# Patient Record
Sex: Female | Born: 1950
Health system: Southern US, Community
[De-identification: ages and names within clinical notes are randomized; demographics above are authoritative.]

## PROBLEM LIST (undated history)

## (undated) DIAGNOSIS — F329 Major depressive disorder, single episode, unspecified: Secondary | ICD-10-CM

## (undated) DIAGNOSIS — I219 Acute myocardial infarction, unspecified: Secondary | ICD-10-CM

## (undated) DIAGNOSIS — I10 Essential (primary) hypertension: Secondary | ICD-10-CM

## (undated) DIAGNOSIS — F32A Depression, unspecified: Secondary | ICD-10-CM

## (undated) DIAGNOSIS — E119 Type 2 diabetes mellitus without complications: Secondary | ICD-10-CM

## (undated) DIAGNOSIS — E785 Hyperlipidemia, unspecified: Secondary | ICD-10-CM

## (undated) DIAGNOSIS — I2510 Atherosclerotic heart disease of native coronary artery without angina pectoris: Secondary | ICD-10-CM

## (undated) DIAGNOSIS — I739 Peripheral vascular disease, unspecified: Secondary | ICD-10-CM

## (undated) DIAGNOSIS — K529 Noninfective gastroenteritis and colitis, unspecified: Secondary | ICD-10-CM

## (undated) DIAGNOSIS — I679 Cerebrovascular disease, unspecified: Secondary | ICD-10-CM

## (undated) DIAGNOSIS — F419 Anxiety disorder, unspecified: Secondary | ICD-10-CM

## (undated) HISTORY — DX: Noninfective gastroenteritis and colitis, unspecified: K52.9

## (undated) HISTORY — DX: Anxiety disorder, unspecified: F41.9

## (undated) HISTORY — DX: Depression, unspecified: F32.A

## (undated) HISTORY — DX: Major depressive disorder, single episode, unspecified: F32.9

## (undated) HISTORY — DX: Hyperlipidemia, unspecified: E78.5

## (undated) HISTORY — DX: Peripheral vascular disease, unspecified: I73.9

## (undated) HISTORY — PX: BREAST BIOPSY: SHX20

## (undated) HISTORY — PX: ABDOMINAL HYSTERECTOMY: SHX81

## (undated) HISTORY — DX: Cerebrovascular disease, unspecified: I67.9

## (undated) HISTORY — DX: Atherosclerotic heart disease of native coronary artery without angina pectoris: I25.10

## (undated) HISTORY — DX: Essential (primary) hypertension: I10

---

## 2006-11-13 HISTORY — PX: CORONARY ANGIOPLASTY WITH STENT PLACEMENT: SHX49

## 2006-11-23 ENCOUNTER — Inpatient Hospital Stay (HOSPITAL_COMMUNITY): Admission: EM | Admit: 2006-11-23 | Discharge: 2006-11-25 | Payer: Self-pay | Admitting: Emergency Medicine

## 2006-11-23 ENCOUNTER — Ambulatory Visit: Payer: Self-pay | Admitting: Cardiology

## 2006-12-07 ENCOUNTER — Ambulatory Visit: Payer: Self-pay | Admitting: Cardiology

## 2006-12-13 ENCOUNTER — Ambulatory Visit: Payer: Self-pay | Admitting: Cardiology

## 2006-12-16 ENCOUNTER — Observation Stay (HOSPITAL_COMMUNITY): Admission: AD | Admit: 2006-12-16 | Discharge: 2006-12-17 | Payer: Self-pay | Admitting: Cardiovascular Disease

## 2006-12-16 ENCOUNTER — Ambulatory Visit: Payer: Self-pay | Admitting: Cardiovascular Disease

## 2006-12-17 ENCOUNTER — Encounter: Payer: Self-pay | Admitting: Cardiology

## 2006-12-25 ENCOUNTER — Ambulatory Visit: Payer: Self-pay | Admitting: Cardiology

## 2006-12-25 ENCOUNTER — Encounter: Payer: Self-pay | Admitting: Cardiology

## 2006-12-31 ENCOUNTER — Ambulatory Visit: Payer: Self-pay | Admitting: Cardiology

## 2007-05-28 ENCOUNTER — Ambulatory Visit: Payer: Self-pay | Admitting: Cardiology

## 2007-06-11 ENCOUNTER — Ambulatory Visit: Payer: Self-pay | Admitting: Cardiology

## 2007-06-28 ENCOUNTER — Ambulatory Visit: Payer: Self-pay

## 2007-07-18 ENCOUNTER — Ambulatory Visit: Payer: Self-pay | Admitting: Cardiology

## 2007-10-17 ENCOUNTER — Ambulatory Visit: Payer: Self-pay | Admitting: Cardiology

## 2007-11-26 ENCOUNTER — Encounter: Payer: Self-pay | Admitting: Physician Assistant

## 2008-08-31 ENCOUNTER — Encounter: Payer: Self-pay | Admitting: Cardiology

## 2008-09-22 ENCOUNTER — Ambulatory Visit: Payer: Self-pay | Admitting: Physician Assistant

## 2008-09-22 ENCOUNTER — Encounter: Payer: Self-pay | Admitting: Cardiology

## 2008-10-01 ENCOUNTER — Ambulatory Visit: Payer: Self-pay | Admitting: Cardiology

## 2008-10-01 ENCOUNTER — Encounter: Payer: Self-pay | Admitting: Cardiology

## 2008-12-23 ENCOUNTER — Ambulatory Visit: Payer: Self-pay | Admitting: Cardiology

## 2009-01-06 ENCOUNTER — Encounter: Payer: Self-pay | Admitting: Cardiology

## 2009-01-28 ENCOUNTER — Ambulatory Visit: Payer: Self-pay | Admitting: Cardiology

## 2009-01-29 ENCOUNTER — Observation Stay (HOSPITAL_COMMUNITY): Admission: AD | Admit: 2009-01-29 | Discharge: 2009-01-29 | Payer: Self-pay | Admitting: Cardiology

## 2009-01-29 ENCOUNTER — Encounter: Payer: Self-pay | Admitting: Cardiology

## 2009-01-29 ENCOUNTER — Ambulatory Visit: Payer: Self-pay | Admitting: Internal Medicine

## 2009-02-10 ENCOUNTER — Ambulatory Visit: Payer: Self-pay | Admitting: Physician Assistant

## 2009-09-21 ENCOUNTER — Ambulatory Visit: Payer: Self-pay | Admitting: Cardiology

## 2009-09-21 ENCOUNTER — Encounter (INDEPENDENT_AMBULATORY_CARE_PROVIDER_SITE_OTHER): Payer: Self-pay | Admitting: *Deleted

## 2009-09-21 DIAGNOSIS — F341 Dysthymic disorder: Secondary | ICD-10-CM

## 2009-09-21 DIAGNOSIS — K551 Chronic vascular disorders of intestine: Secondary | ICD-10-CM

## 2009-09-21 DIAGNOSIS — I10 Essential (primary) hypertension: Secondary | ICD-10-CM

## 2009-09-21 DIAGNOSIS — F1020 Alcohol dependence, uncomplicated: Secondary | ICD-10-CM

## 2009-09-21 DIAGNOSIS — F411 Generalized anxiety disorder: Secondary | ICD-10-CM | POA: Insufficient documentation

## 2009-09-21 DIAGNOSIS — I701 Atherosclerosis of renal artery: Secondary | ICD-10-CM | POA: Insufficient documentation

## 2009-09-21 DIAGNOSIS — E785 Hyperlipidemia, unspecified: Secondary | ICD-10-CM

## 2009-09-21 DIAGNOSIS — R609 Edema, unspecified: Secondary | ICD-10-CM

## 2009-09-27 ENCOUNTER — Ambulatory Visit: Payer: Self-pay | Admitting: Cardiology

## 2009-10-22 ENCOUNTER — Encounter (INDEPENDENT_AMBULATORY_CARE_PROVIDER_SITE_OTHER): Payer: Self-pay | Admitting: *Deleted

## 2009-10-22 ENCOUNTER — Encounter: Payer: Self-pay | Admitting: Cardiology

## 2009-11-01 ENCOUNTER — Encounter: Payer: Self-pay | Admitting: Cardiology

## 2009-11-04 ENCOUNTER — Encounter: Payer: Self-pay | Admitting: Cardiology

## 2009-11-08 ENCOUNTER — Ambulatory Visit: Payer: Self-pay | Admitting: Cardiovascular Disease

## 2009-11-08 ENCOUNTER — Inpatient Hospital Stay (HOSPITAL_BASED_OUTPATIENT_CLINIC_OR_DEPARTMENT_OTHER): Admission: RE | Admit: 2009-11-08 | Discharge: 2009-11-08 | Payer: Self-pay | Admitting: Cardiovascular Disease

## 2009-12-01 ENCOUNTER — Ambulatory Visit: Payer: Self-pay | Admitting: Cardiology

## 2010-07-08 ENCOUNTER — Ambulatory Visit: Payer: Self-pay | Admitting: Cardiology

## 2010-07-08 DIAGNOSIS — G4700 Insomnia, unspecified: Secondary | ICD-10-CM

## 2010-12-13 NOTE — Cardiovascular Report (Signed)
Summary: Cardiac Catheterization  Cardiac Catheterization   Imported By: Dorise Hiss 07/07/2010 13:33:34  _____________________________________________________________________  External Attachment:    Type:   Image     Comment:   External Document

## 2010-12-13 NOTE — Assessment & Plan Note (Signed)
Summary: 6 mo fu per july reminder   Visit Type:  Follow-up Primary Provider:  Dr. Lia Hopping   History of Present Illness: the patient is an 60 year old female with a history of non-ST elevation myocardial infarction status post bare-metal stenting of the proximal circumflex coronary artery she has normal LV function.  Chest peripheral arterial disease with mild left renal artery stenosis and severe ostial SMA stenosis.  She has nonobstructive cerebrovascular disease.  From a cardiac standpoint she is stable.  She denies any chest pain or shortness of breath.  Lipid panels followed by Dr. Olena Leatherwood.  Her only complaint today in the office is significant insomnia.  The patient has history of anxiety and depression.  She denies any palpitations or syncope orthopnea or PND.  Preventive Screening-Counseling & Management  Alcohol-Tobacco     Smoking Status: never  Current Medications (verified): 1)  Carvedilol 12.5 Mg Tabs (Carvedilol) .... Take One Tablet By Mouth Twice A Day 2)  Allegra 180 Mg Tabs (Fexofenadine Hcl) .... Take 1 Tablet By Mouth As Directed As Needed 3)  Amlodipine Besylate 10 Mg Tabs (Amlodipine Besylate) .... Take 1 Tablet By Mouth Once A Day 4)  Aspirin 81 Mg Tbec (Aspirin) .... Take One Tablet By Mouth Daily 5)  Plavix 75 Mg Tabs (Clopidogrel Bisulfate) .... Take 1 Tablet By Mouth Once A Day 6)  Vytorin 10-20 Mg Tabs (Ezetimibe-Simvastatin) .... Take 1 Tablet By Mouth Once A Day 7)  Nitrostat 0.4 Mg Subl (Nitroglycerin) .... Use As Directed 8)  Alprazolam 0.25 Mg Tabs (Alprazolam) .... Take 1 Tablet By Mouth Three Times A Day As Needed 9)  Effexor Xr 75 Mg Xr24h-Cap (Venlafaxine Hcl) .... Take 3 Tablet By Mouth Once A Day 10)  Preservision/lutein  Caps (Multiple Vitamins-Minerals) .... Take 1 Tablet By Mouth Two Times A Day 11)  Hydrochlorothiazide 25 Mg Tabs (Hydrochlorothiazide) .... Take One Tablet By Mouth Daily. 12)  Isosorbide Mononitrate Cr 30 Mg Xr24h-Tab  (Isosorbide Mononitrate) .... Take 1 Tablet By Mouth Once A Day 13)  Potassium Chloride Crys Cr 20 Meq Cr-Tabs (Potassium Chloride Crys Cr) .... Take One Tablet By Mouth Daily 14)  Fish Oil 1000 Mg Caps (Omega-3 Fatty Acids) .... Take 1 Tablet By Mouth Two Times A Day 15)  One-A-Day Extras Antioxidant  Caps (Multiple Vitamins-Minerals) .... Take 1 Tablet By Mouth Once A Day 16)  Trazodone Hcl 50 Mg Tabs (Trazodone Hcl) .... Take 1 Tab By Mouth At Bedtime  Allergies: 1)  ! Pcn  Comments:  Nurse/Medical Assistant: The patient's medication list and allergies were reviewed with the patient and were updated in the Medication and Allergy Lists.  Past History:  Past Medical History: Last updated: 09/21/2009 CAD A.  status post NST EMI/bare-metal stenting of proximal CFX, January 2008: widely patent stent site by a relook cardiac catheterization, February 2008. B.  Hyperdynamic left ventricular function peripheral arterial disease A.  mild left renal artery stenosis B.  severe, ostial SMA stenosis nonobstructive cerebrovascular disease dyslipidemia hypertension anxiety/depression colitis  1. Coronary artery disease with jailed obtuse marginal branch     approximately 80% stenosis. 2. Hypertension controlled. 3. Peripheral arterial disease. 4. Nonobstructive cardiovascular disease. 5. Dyslipidemia.   Past Surgical History: Last updated: 12/23/2008 status post hysterectomy and  Family History: Last updated: 12/23/2008 Negative FH of Diabetes, Hypertension, or Coronary Artery Disease  Social History: Last updated: 12/23/2008 Tobacco Use - No.   Risk Factors: Smoking Status: never (07/08/2010)  Review of Systems  The patient denies fatigue,  malaise, fever, weight gain/loss, vision loss, decreased hearing, hoarseness, chest pain, palpitations, shortness of breath, prolonged cough, wheezing, sleep apnea, coughing up blood, abdominal pain, blood in stool, nausea, vomiting,  diarrhea, heartburn, incontinence, blood in urine, muscle weakness, joint pain, leg swelling, rash, skin lesions, headache, fainting, dizziness, depression, anxiety, enlarged lymph nodes, easy bruising or bleeding, and environmental allergies.    Vital Signs:  Patient profile:   60 year old female Height:      59 inches Weight:      150 pounds Pulse rate:   69 / minute BP sitting:   118 / 67  (left arm) Cuff size:   regular  Vitals Entered By: Carlye Grippe (July 08, 2010 10:54 AM)  Physical Exam  Additional Exam:  General: Well-developed, well-nourished in no distress head: Normocephalic and atraumatic eyes PERRLA/EOMI intact, conjunctiva and lids normal nose: No deformity or lesions mouth normal dentition, normal posterior pharynx neck: Supple, no JVD.  No masses, thyromegaly or abnormal cervical nodes lungs: Normal breath sounds bilaterally without wheezing.  Normal percussion heart: regular rate and rhythm with normal S1 and S2, no S3 or S4.  PMI is normal.  No pathological murmurs abdomen: Normal bowel sounds, abdomen is soft and nontender without masses, organomegaly or hernias noted.  No hepatosplenomegaly musculoskeletal: Back normal, normal gait muscle strength and tone normal pulsus: Pulse is normal in all 4 extremities Extremities: No peripheral pitting edema neurologic: Alert and oriented x 3 skin: Intact without lesions or rashes cervical nodes: No significant adenopathy psychologic: Normal affect    Impression & Recommendations:  Problem # 1:  ESSENTIAL HYPERTENSION, BENIGN (ICD-401.1) controlled on current medical regimen. Her updated medication list for this problem includes:    Carvedilol 12.5 Mg Tabs (Carvedilol) .Marland Kitchen... Take one tablet by mouth twice a day    Amlodipine Besylate 10 Mg Tabs (Amlodipine besylate) .Marland Kitchen... Take 1 tablet by mouth once a day    Aspirin 81 Mg Tbec (Aspirin) .Marland Kitchen... Take one tablet by mouth daily    Hydrochlorothiazide 25 Mg Tabs  (Hydrochlorothiazide) .Marland Kitchen... Take one tablet by mouth daily.  Problem # 2:  CORONARY ARTERY DISEASE, S/P PTCA (ICD-414.9) no recurrent chest pain.  No ischemia workup required. Her updated medication list for this problem includes:    Carvedilol 12.5 Mg Tabs (Carvedilol) .Marland Kitchen... Take one tablet by mouth twice a day    Amlodipine Besylate 10 Mg Tabs (Amlodipine besylate) .Marland Kitchen... Take 1 tablet by mouth once a day    Aspirin 81 Mg Tbec (Aspirin) .Marland Kitchen... Take one tablet by mouth daily    Plavix 75 Mg Tabs (Clopidogrel bisulfate) .Marland Kitchen... Take 1 tablet by mouth once a day    Nitrostat 0.4 Mg Subl (Nitroglycerin) ..... Use as directed    Isosorbide Mononitrate Cr 30 Mg Xr24h-tab (Isosorbide mononitrate) .Marland Kitchen... Take 1 tablet by mouth once a day  Problem # 3:  INSOMNIA (ICD-780.52) have given the patient prescription of trazodone 50 mg p.o. nightly particularly given her positive anxiety and depression  Patient Instructions: 1)  Trazodone 50 mg daily 2)  Follow up in  6 months Prescriptions: TRAZODONE HCL 50 MG TABS (TRAZODONE HCL) Take 1 tab by mouth at bedtime  #30 x 3   Entered by:   Hoover Brunette, LPN   Authorized by:   Lewayne Bunting, MD, [facility]   Signed by:   Hoover Brunette, LPN on 16/08/9603   Method used:   Electronically to        [facility] [address] (  retail)       [address]       [place], [place]  [address]       Ph: [phone] or [phone]       Fax: [phone]   RxID:   [id number]

## 2010-12-13 NOTE — Cardiovascular Report (Signed)
Summary: Cardiac Catheterization  Cardiac Catheterization   Imported By: Dorise Hiss 07/07/2010 13:35:00  _____________________________________________________________________  External Attachment:    Type:   Image     Comment:   External Document

## 2010-12-13 NOTE — Letter (Signed)
Summary: External Correspondence/ FAXED PRE-CATH ORDER  External Correspondence/ FAXED PRE-CATH ORDER   Imported By: Dorise Hiss 11/18/2009 11:58:21  _____________________________________________________________________  External Attachment:    Type:   Image     Comment:   External Document

## 2010-12-13 NOTE — Assessment & Plan Note (Signed)
Summary: eph-post cath 2 wk fu -srs   Visit Type:  Follow-up Primary Provider:  Dr. Lia Hopping  CC:  follow-up visit.  History of Present Illness: the patient is a 60 year old female with a history of coronary artery disease. Status post stent placement to the circumflex coronary artery. She also has a large second obtuse marginal branch with an 80% ostial lesion which is jailed. Chest normal LV function. In December she developed chest pain and had a Cardiolite study done which was abnormal. The patient was referred for cardiac catheterization, but no change in her anatomy was found. Medical treatment was recommended. The patient states that she can quite well. Has no chest pain orthopnea PND palpitations or syncope. She is compliant with her medical regimen. She remains on aspirin and Plavix. She reports no complication related to her recent cardiac catheterization.  Preventive Screening-Counseling & Management  Alcohol-Tobacco     Smoking Status: never  Current Problems (verified): 1)  Dysthymic Disorder  (ICD-300.4) 2)  Alcoholism  (ICD-303.90) 3)  Leg Edema  (ICD-782.3) 4)  Anxiety  (ICD-300.00) 5)  Dyslipidemia  (ICD-272.4) 6)  Essential Hypertension, Benign  (ICD-401.1) 7)  Chronic Vascular Insufficiency of Intestine  (ICD-557.1) 8)  Renal Artery Stenosis  (ICD-440.1) 9)  Coronary Artery Disease, S/p Ptca  (ICD-414.9)  Current Medications (verified): 1)  Carvedilol 12.5 Mg Tabs (Carvedilol) .... Take One Tablet By Mouth Twice A Day 2)  Allegra 180 Mg Tabs (Fexofenadine Hcl) .... Take 1 Tablet By Mouth As Directed 3)  Amlodipine Besylate 10 Mg Tabs (Amlodipine Besylate) .... Take 1 Tablet By Mouth Once A Day 4)  Aspirin 81 Mg Tbec (Aspirin) .... Take One Tablet By Mouth Daily 5)  Plavix 75 Mg Tabs (Clopidogrel Bisulfate) .... Take 1 Tablet By Mouth Once A Day 6)  Vytorin 10-20 Mg Tabs (Ezetimibe-Simvastatin) .... Take 1 Tablet By Mouth Once A Day 7)  Nitrostat 0.4 Mg Subl  (Nitroglycerin) .... Use As Directed 8)  Alprazolam 0.25 Mg Tabs (Alprazolam) .... Take 1 Tablet By Mouth Three Times A Day As Needed 9)  Effexor Xr 75 Mg Xr24h-Cap (Venlafaxine Hcl) .... Take 3 Tablet By Mouth Once A Day 10)  Preservision/lutein  Caps (Multiple Vitamins-Minerals) .... Take 1 Tablet By Mouth Two Times A Day 11)  Hydrochlorothiazide 25 Mg Tabs (Hydrochlorothiazide) .... Take One Tablet By Mouth Daily. 12)  Isosorbide Mononitrate Cr 30 Mg Xr24h-Tab (Isosorbide Mononitrate) .... Take 1 Tablet By Mouth Once A Day 13)  Potassium Chloride Crys Cr 20 Meq Cr-Tabs (Potassium Chloride Crys Cr) .... Take One Tablet By Mouth Daily  Allergies: 1)  ! Pcn  Comments:  Nurse/Medical Assistant: The patient's medications were reviewed with the patient and were updated in the Medication List. Pt brought medications to office visit. Cyril Loosen, RN, BSN (December 01, 2009 10:47 AM)  Past History:  Past Medical History: Last updated: 09/21/2009 CAD A.  status post NST EMI/bare-metal stenting of proximal CFX, January 2008: widely patent stent site by a relook cardiac catheterization, February 2008. B.  Hyperdynamic left ventricular function peripheral arterial disease A.  mild left renal artery stenosis B.  severe, ostial SMA stenosis nonobstructive cerebrovascular disease dyslipidemia hypertension anxiety/depression colitis  1. Coronary artery disease with jailed obtuse marginal branch     approximately 80% stenosis. 2. Hypertension controlled. 3. Peripheral arterial disease. 4. Nonobstructive cardiovascular disease. 5. Dyslipidemia.   Past Surgical History: Last updated: 12/23/2008 status post hysterectomy and  Family History: Last updated: 12/23/2008 Negative FH of Diabetes, Hypertension,  or Coronary Artery Disease  Social History: Last updated: 12/23/2008 Tobacco Use - No.   Risk Factors: Smoking Status: never (12/01/2009)  Review of Systems  The patient  denies fatigue, malaise, fever, weight gain/loss, vision loss, decreased hearing, hoarseness, chest pain, palpitations, shortness of breath, prolonged cough, wheezing, sleep apnea, coughing up blood, abdominal pain, blood in stool, nausea, vomiting, diarrhea, heartburn, incontinence, blood in urine, muscle weakness, joint pain, leg swelling, rash, skin lesions, headache, fainting, dizziness, depression, anxiety, enlarged lymph nodes, easy bruising or bleeding, and environmental allergies.    Vital Signs:  Patient profile:   60 year old female Height:      59 inches Weight:      148.50 pounds BMI:     30.10 Pulse rate:   80 / minute BP sitting:   126 / 72  (left arm) Cuff size:   regular  Vitals Entered By: Cyril Loosen, RN, BSN (December 01, 2009 10:43 AM)  Nutrition Counseling: Patient's BMI is greater than 25 and therefore counseled on weight management options. CC: follow-up visit   Physical Exam  Additional Exam:  General: Well-developed, well-nourished in no distress head: Normocephalic and atraumatic eyes PERRLA/EOMI intact, conjunctiva and lids normal nose: No deformity or lesions mouth normal dentition, normal posterior pharynx neck: Supple, no JVD.  No masses, thyromegaly or abnormal cervical nodes lungs: Normal breath sounds bilaterally without wheezing.  Normal percussion heart: regular rate and rhythm with normal S1 and S2, no S3 or S4.  PMI is normal.  No pathological murmurs abdomen: Normal bowel sounds, abdomen is soft and nontender without masses, organomegaly or hernias noted.  No hepatosplenomegaly musculoskeletal: Back normal, normal gait muscle strength and tone normal pulsus: Pulse is normal in all 4 extremities Extremities: No peripheral pitting edema neurologic: Alert and oriented x 3 skin: Intact without lesions or rashes cervical nodes: No significant adenopathy psychologic: Normal affect    Cardiac Cath  Procedure date:  11/08/2009  Findings:       Left anterior descending artery was normal in its proximal, mid, and distal portion.  First diagonal branch was a medium-sized vessel and normal.  Second diagonal branch was small and normal.   Circumflex coronary artery was codominant.  There was a 30-40% smooth tubular lesion in the mid circumflex at the previous stent site.  The first OM was a small vessel without critical disease.  The second obtuse marginal branch itself had a 70% ostial lesion.  It appeared to be 'jailed' from the distal aspect of the previously placed stent.  It was unchanged from the study done in March.  The patient had 3 posterolateral branches and a  PDA.  There was a 40% distal circumflex disease.   The right coronary artery was small, but codominant.   The proximal vessel was normal.  The midvessel had a 30-40% smooth lesion at the takeoff of a medium-sized RV branch.  The ostium of the RV branch had an 80% discrete stenosis.   The PDA and distal RCA were normal.   RAO ventriculography:  RAO ventriculography was normal with EF was 70%. There was no gradient across the aortic valve and no MR.  Impression & Recommendations:  Problem # 1:  CORONARY ARTERY DISEASE, S/P PTCA (ICD-414.9) the patient has a recent cardiac catheterization. She does have a jailed obtuse marginal branch. There is an 80% ostial lesion but this is unchanged. The patient can continue on antianginal therapy. Currently however she is pain-free and therefore I made no changes in  her medical regimen. Her updated medication list for this problem includes:    Carvedilol 12.5 Mg Tabs (Carvedilol) .Marland Kitchen... Take one tablet by mouth twice a day    Amlodipine Besylate 10 Mg Tabs (Amlodipine besylate) .Marland Kitchen... Take 1 tablet by mouth once a day    Aspirin 81 Mg Tbec (Aspirin) .Marland Kitchen... Take one tablet by mouth daily    Plavix 75 Mg Tabs (Clopidogrel bisulfate) .Marland Kitchen... Take 1 tablet by mouth once a day    Nitrostat 0.4 Mg Subl (Nitroglycerin) ..... Use as  directed    Isosorbide Mononitrate Cr 30 Mg Xr24h-tab (Isosorbide mononitrate) .Marland Kitchen... Take 1 tablet by mouth once a day  Problem # 2:  ANXIETY (ICD-300.00) the patient's anxiety is improved. She's treated with venlafaxine  Problem # 3:  DYSLIPIDEMIA (ICD-272.4) the patient will continue on lipid lowering therapy. Her lipid panels followed by primary care physician. Her updated medication list for this problem includes:    Vytorin 10-20 Mg Tabs (Ezetimibe-simvastatin) .Marland Kitchen... Take 1 tablet by mouth once a day  Problem # 4:  ESSENTIAL HYPERTENSION, BENIGN (ICD-401.1) blood pressure is well controlled. No changes in her antihypertensive regimen have been recommended. Her updated medication list for this problem includes:    Carvedilol 12.5 Mg Tabs (Carvedilol) .Marland Kitchen... Take one tablet by mouth twice a day    Amlodipine Besylate 10 Mg Tabs (Amlodipine besylate) .Marland Kitchen... Take 1 tablet by mouth once a day    Aspirin 81 Mg Tbec (Aspirin) .Marland Kitchen... Take one tablet by mouth daily    Hydrochlorothiazide 25 Mg Tabs (Hydrochlorothiazide) .Marland Kitchen... Take one tablet by mouth daily.  Patient Instructions: 1)  Your physician recommends that you continue on your current medications as directed. Please refer to the Current Medication list given to you today. 2)  Follow up in  6 months.

## 2011-01-05 ENCOUNTER — Encounter: Payer: Self-pay | Admitting: Cardiology

## 2011-01-05 ENCOUNTER — Ambulatory Visit (INDEPENDENT_AMBULATORY_CARE_PROVIDER_SITE_OTHER): Payer: 59 | Admitting: Cardiology

## 2011-01-05 DIAGNOSIS — I2510 Atherosclerotic heart disease of native coronary artery without angina pectoris: Secondary | ICD-10-CM

## 2011-01-05 DIAGNOSIS — Z9861 Coronary angioplasty status: Secondary | ICD-10-CM

## 2011-01-10 NOTE — Assessment & Plan Note (Signed)
Summary: 6 month fu per reminder-LA vts   Visit Type:  Follow-up Primary Provider:  Lonzo Candy   History of Present Illness: The patient is a 60 year old female with a history of non-ST elevation myocardial infarction status post bare-metal stenting of the proximal circumflex coronary artery.  Shows normal LV function.  She also has peripheral arterial disease with mild left renal artery stenosis and severe ostial SMA stenosis.  She has nonobstructive cerebrovascular disease.  She was last seen in the office in August 2011.  She has been doing well.  Just called in several refills for her Norvasc , hydrochlorothiazide, Plavix and isosorbide mononitrate. Of note is that the patient also has a history of anxiety and depression.  He seemed to be under good control during the last visit. The patient also has a history of hypertension and dyslipidemia. The patient has been doing well from a cardiac perspective.  She did have a couple of days where she developed substernal chest pain but this was in the setting of emotional stress.  She told me that her cousin got murdered.  She also some epistaxis around that time and was able to continue her Plavix and her nosebleed spontaneously resolved.  She denies any shortness of breath palpitations or syncope. EKG was reviewed.  The patient in normal sinus rhythm.   Preventive Screening-Counseling & Management  Alcohol-Tobacco     Smoking Status: never  Current Medications (verified): 1)  Carvedilol 12.5 Mg Tabs (Carvedilol) .... Take One Tablet By Mouth Twice A Day 2)  Allegra 180 Mg Tabs (Fexofenadine Hcl) .... Take 1 Tablet By Mouth As Directed As Needed 3)  Amlodipine Besylate 10 Mg Tabs (Amlodipine Besylate) .... Take 1 Tablet By Mouth Once A Day 4)  Aspirin 81 Mg Tbec (Aspirin) .... Take One Tablet By Mouth Daily 5)  Plavix 75 Mg Tabs (Clopidogrel Bisulfate) .... Take 1 Tablet By Mouth Once A Day 6)  Vytorin 10-20 Mg Tabs (Ezetimibe-Simvastatin)  .... Take 1 Tablet By Mouth Once A Day 7)  Nitrostat 0.4 Mg Subl (Nitroglycerin) .... Use As Directed 8)  Alprazolam 0.25 Mg Tabs (Alprazolam) .... Take 1 Tablet By Mouth Twice Times A Day As Needed 9)  Effexor Xr 75 Mg Xr24h-Cap (Venlafaxine Hcl) .... Take 3 Tablet By Mouth Once A Day 10)  Preservision/lutein  Caps (Multiple Vitamins-Minerals) .... Take 1 Tablet By Mouth Two Times A Day 11)  Hydrochlorothiazide 25 Mg Tabs (Hydrochlorothiazide) .... Take One Tablet By Mouth Daily. 12)  Isosorbide Mononitrate Cr 30 Mg Xr24h-Tab (Isosorbide Mononitrate) .... Take 1 Tablet By Mouth Once A Day 13)  Potassium Chloride Crys Cr 20 Meq Cr-Tabs (Potassium Chloride Crys Cr) .... Take One Tablet By Twice Daily 14)  Fish Oil 1000 Mg Caps (Omega-3 Fatty Acids) .... Take 1 Tablet By Mouth Two Times A Day 15)  One-A-Day Extras Antioxidant  Caps (Multiple Vitamins-Minerals) .... Take 1 Tablet By Mouth Once A Day 16)  Trazodone Hcl 50 Mg Tabs (Trazodone Hcl) .... Take 1 Tab By Mouth At Bedtime  Allergies (verified): 1)  ! Pcn  Comments:  Nurse/Medical Assistant: The patient's medication bottles and allergies were reviewed with the patient and were updated in the Medication and Allergy Lists.  Past History:  Past Medical History: Last updated: 09/21/2009 CAD A.  status post NST EMI/bare-metal stenting of proximal CFX, January 2008: widely patent stent site by a relook cardiac catheterization, February 2008. B.  Hyperdynamic left ventricular function peripheral arterial disease A.  mild left renal artery  stenosis B.  severe, ostial SMA stenosis nonobstructive cerebrovascular disease dyslipidemia hypertension anxiety/depression colitis  1. Coronary artery disease with jailed obtuse marginal branch     approximately 80% stenosis. 2. Hypertension controlled. 3. Peripheral arterial disease. 4. Nonobstructive cardiovascular disease. 5. Dyslipidemia.   Past Surgical History: Last updated:  12/23/2008 status post hysterectomy and  Family History: Last updated: 12/23/2008 Negative FH of Diabetes, Hypertension, or Coronary Artery Disease  Social History: Last updated: 12/23/2008 Tobacco Use - No.   Risk Factors: Smoking Status: never (01/05/2011)  Review of Systems  The patient denies fatigue, malaise, fever, weight gain/loss, vision loss, decreased hearing, hoarseness, chest pain, palpitations, shortness of breath, prolonged cough, wheezing, sleep apnea, coughing up blood, abdominal pain, blood in stool, nausea, vomiting, diarrhea, heartburn, incontinence, blood in urine, muscle weakness, joint pain, leg swelling, rash, skin lesions, headache, fainting, dizziness, depression, anxiety, enlarged lymph nodes, easy bruising or bleeding, and environmental allergies.    Vital Signs:  Patient profile:   60 year old female Height:      59 inches Weight:      150 pounds BMI:     30.41 Pulse rate:   78 / minute BP sitting:   126 / 66  (left arm) Cuff size:   regular  Vitals Entered By: Carlye Grippe (January 05, 2011 9:58 AM)  Nutrition Counseling: Patient's BMI is greater than 25 and therefore counseled on weight management options.  Physical Exam  Additional Exam:  General: Well-developed, well-nourished in no distress head: Normocephalic and atraumatic eyes PERRLA/EOMI intact, conjunctiva and lids normal nose: No deformity or lesions mouth normal dentition, normal posterior pharynx neck: Supple, no JVD.  No masses, thyromegaly or abnormal cervical nodes lungs: Normal breath sounds bilaterally without wheezing.  Normal percussion heart: regular rate and rhythm with normal S1 and S2, no S3 or S4.  PMI is normal.  No pathological murmurs abdomen: Normal bowel sounds, abdomen is soft and nontender without masses, organomegaly or hernias noted.  No hepatosplenomegaly musculoskeletal: Back normal, normal gait muscle strength and tone normal pulsus: Pulse is normal in all  4 extremities Extremities: No peripheral pitting edema neurologic: Alert and oriented x 3 skin: Intact without lesions or rashes cervical nodes: No significant adenopathy psychologic: Normal affect    EKG  Procedure date:  01/05/2011  Findings:      normal sinus rhythm normal EKG  Impression & Recommendations:  Problem # 1:  CORONARY ARTERY DISEASE, S/P PTCA (ICD-414.9) coronary artery disease status post non-ST elevation microinfarction stents to the circumflex - BMS stent was placed in 2009 and the patient is asymptomatic.  No indication for acute ischemia testing at the present time.  Her updated medication list for this problem includes:    Carvedilol 12.5 Mg Tabs (Carvedilol) .Marland Kitchen... Take one tablet by mouth twice a day    Amlodipine Besylate 10 Mg Tabs (Amlodipine besylate) .Marland Kitchen... Take 1 tablet by mouth once a day    Aspirin 81 Mg Tbec (Aspirin) .Marland Kitchen... Take one tablet by mouth daily    Plavix 75 Mg Tabs (Clopidogrel bisulfate) .Marland Kitchen... Take 1 tablet by mouth once a day    Nitrostat 0.4 Mg Subl (Nitroglycerin) ..... Use as directed    Isosorbide Mononitrate Cr 30 Mg Xr24h-tab (Isosorbide mononitrate) .Marland Kitchen... Take 1 tablet by mouth once a day  Orders: EKG w/ Interpretation (93000)  Problem # 2:  ESSENTIAL HYPERTENSION, BENIGN (ICD-401.1)  Her updated medication list for this problem includes:    Carvedilol 12.5 Mg Tabs (Carvedilol) .Marland Kitchen... Take  one tablet by mouth twice a day    Amlodipine Besylate 10 Mg Tabs (Amlodipine besylate) .Marland Kitchen... Take 1 tablet by mouth once a day    Aspirin 81 Mg Tbec (Aspirin) .Marland Kitchen... Take one tablet by mouth daily    Hydrochlorothiazide 25 Mg Tabs (Hydrochlorothiazide) .Marland Kitchen... Take one tablet by mouth daily.  Problem # 3:  DYSLIPIDEMIA (ICD-272.4) followed by the bases primary care physician Her updated medication list for this problem includes:    Vytorin 10-20 Mg Tabs (Ezetimibe-simvastatin) .Marland Kitchen... Take 1 tablet by mouth once a day  Patient  Instructions: 1)  Your physician wants you to follow-up in: 6 months. You will receive a reminder letter in the mail one-two months in advance. If you don't receive a letter, please call our office to schedule the follow-up appointment. 2)  Your physician recommends that you continue on your current medications as directed. Please refer to the Current Medication list given to you today. Prescriptions: VYTORIN 10-20 MG TABS (EZETIMIBE-SIMVASTATIN) Take 1 tablet by mouth once a day  #30 x 6   Entered by:   Cyril Loosen, RN, BSN   Authorized by:   Lewayne Bunting, MD, [facility]   Signed by:   Cyril Loosen, RN, BSN on 01/05/2011   Method used:   Electronically to        [facility] [address] (retail)       [address]       [place], [place]  [address]       Ph: [phone] or [phone]       Fax: [phone]   RxID:   [id number] CARVEDILOL 12.5 MG TABS (CARVEDILOL) Take one tablet by mouth twice a day  #60 Each x 6   Entered by:   Cyril Loosen, RN, BSN   Authorized by:   Lewayne Bunting, MD, [facility]   Signed by:   Cyril Loosen, RN, BSN on 01/05/2011   Method used:   Electronically to        [facility] [address] (retail)       [address]       [place], [place]  [address]       Ph: [phone] or [phone]       Fax: [phone]   RxID:   [id number]

## 2011-03-28 NOTE — Discharge Summary (Signed)
NAME:  [name], [name]                ACCOUNT NO.:  [id number]   MEDICAL RECORD NO.:  [id number]          PATIENT TYPE:  INP   LOCATION:  4703                         FACILITY:  [facility]   PHYSICIAN:  Bevelyn Buckles. Bensimhon, MDDATE OF BIRTH:  1951-07-10   DATE OF ADMISSION:  01/29/2009  DATE OF DISCHARGE:  01/29/2009                               DISCHARGE SUMMARY   PRIMARY CARDIOLOGIST:  Learta Codding, MD, [facility]   PRIMARY CARE PHYSICIAN:  Dr. Celine Mans (in [place], [place]).   DISCHARGE DIAGNOSIS:  Coronary artery disease (status post cardiac  catheterization showing jailed obtuse marginal with approximately 80%  stenosis).   SECONDARY DIAGNOSES:  1. Hypertension (history of poor control).  2. Peripheral arterial disease (history of mild, left renal artery      stenosis).  3. Nonobstructive cardiovascular disease.  4. Dyslipidemia (well controlled).  5. Obesity.   ALLERGIES:  NKDA.  (Question, SHRIMP allergy).   PROCEDURES PERFORMED DURING THIS HOSPITALIZATION:  1. Cardiac catheterization on January 29, 2009, that showed moderate in-      stent restenosis of left circumflex stent with jailed large obtuse      marginal.  2. Normal left ventricular function.   BRIEF HISTORY OF PRESENT ILLNESS:  On January 28, 2009, the patient began  experiencing significant chest pain and due to her history of coronary  artery disease, she decided to present to [facility] Emergency  Department for evaluation.  She was subsequently transferred to [facility] for cardiac catheterization (see results above).   HOSPITAL COURSE:  The patient was admitted and underwent cardiac  catheterization described above.  The patient tolerated the procedure  well without significant complications.  The patient after completion of  post cath orders and was discharged on January 29, 2009.  The plan moving  forward will be to have an outpatient Myoview completed to determine if  stenosis revealed on cardiac  catheterization is leading to ischemia.  If  the Myoview is positive, the patient will return to [facility]  for planned PCI of left circumflex and obtuse marginal.  If the Myoview  is negative, the patient will be medically managed.  This plan was  reviewed with Dr. Excell Seltzer by Dr. Gala Romney.  The patient's vital signs  remained stable during hospital course.   MOST RECENT VITAL SIGNS AT THE TIME OF DISCHARGE:  Temp 98.1 degrees  Fahrenheit, BP 139/69, pulse 75, respirations 18, and O2 saturation 96%  on room air.   The patient will receive post cath instructions, medication list, and  followup instructions in both oral and written form.  All of these have  been discussed with the patient at the time of discharge.  She had no  questions or concerns that had not been addressed.   DISCHARGE LABORATORY DATA:  No labs drawn at [facility].   FOLLOWUP PLANS AND APPOINTMENTS:  Outpatient Myoview to be scheduled.  The patient will be contacted with the date and time.   DISCHARGE MEDICATIONS:  1. Allegra 180 mg p.o. daily/p.r.n.  2. Alprazolam 0.25 mg p.o. up to t.i.d. p.r.n.  3. Norvasc 10 mg p.o. daily.  4. Aspirin 81 mg p.o. daily.  5. Coreg 12.5 mg p.o. b.i.d.  6. Vytorin 10/20 mg p.o. daily.  7. Plavix 75 mg p.o. daily.  8. Nitroglycerin 0.4 mg p.r.n. for chest pain.   DURATION OF DISCHARGE ENCOUNTER INCLUDING PHYSICIAN TIME:  35 minutes.      Jarrett Ables, [facility]      Daniel R. Bensimhon, MD  Electronically Signed    MS/MEDQ  D:  01/29/2009  T:  01/30/2009  Job:  [id number]   cc:   Bevelyn Buckles. Bensimhon, MD  Learta Codding, MD,FACC

## 2011-03-28 NOTE — Assessment & Plan Note (Signed)
[facility]                          [facility] NOTE   NAME:[name], [name]                 MRN:          [id number]  DATE:02/10/2009                            DOB:          1951/10/14    REFERRING PHYSICIAN:  Linward Foster   The patient is a pleasant 60 year old female with a history of coronary  artery disease.  He was recently admitted with chest pain and referred  for cardiac catheterization.  The patient was found that her stent was  patent in the circumflex, but she had a jailed obtuse marginal branch  which was large 80% ostial stenosis.  However, it is felt that this  would be difficult to treat and recommendations were to do a stress test  prior to intervention.  In the interim; however, the patient has  remained asymptomatic with adjusting of medication particularly  increasing her Norvasc.  She has had no further angina.  She also  required no nitroglycerin.  She denies any orthopnea, PND, and she has  resumed all normal activity.   MEDICATIONS:  1. Effexor XR 75 mg 3 tablets daily.  2. Allegra 180 mg p.o. daily.  3. Carvedilol 12.5 mg p.o. b.i.d.  4. Vytorin 10/20 daily.  5. Aspirin 81 mg p.o. daily.  6. Amlodipine 10 mg p.o. daily.  7. Plavix 75 mg p.o. daily.   PHYSICAL EXAMINATION:  VITAL SIGNS:  Blood pressure 137/77, heart rate  78, weight 151 pounds.  NECK:  Normal carotid upstroke and no carotid bruits.  LUNGS:  Clear breath sounds bilaterally.  HEART:  Regular rate and rhythm.  Normal S1 and S2.  No murmurs, rubs,  or gallops.  ABDOMEN:  Soft, nontender.  No rebound or guarding.  Good bowel sounds.  EXTREMITIES:  No cyanosis, clubbing, or edema.   PROBLEM LIST:  1. Coronary artery disease with jailed obtuse marginal branch      approximately 80% stenosis.  2. Hypertension controlled.  3. Peripheral arterial disease.  4. Nonobstructive cardiovascular disease.  5. Dyslipidemia.   PLAN:  1. At this  point, I do not think we need to do an exercise test.  The      patient's symptoms are controlled with medical therapy.  2. If she has recurrent chest pain, we will consider PCI of the obtuse      marginal branch.  3. The patient can follow up with [place] in 6 months.     Learta Codding, MD,FACC  Electronically Signed    GED/MedQ  DD: 02/10/2009  DT: 02/11/2009  Job #: [id number]   cc:   Linward Foster

## 2011-03-28 NOTE — Assessment & Plan Note (Signed)
[facility]                          [facility] NOTE   NAME:Matherly, LASHONA SCHAAF                 MRN:          [id number]  DATE:12/23/2008                            DOB:          1951/08/08    PRIMARY CARDIOLOGIST:  Learta Codding, MD, [facility]   REASON FOR VISIT:  Scheduled followup.   Ms. Joens continues to do extremely well since last seen here in the  clinic this past November.  She denies any interim development of  exertional angina pectoris.  She has gained 6 pounds, however, but does  not suggest any symptoms of congestive heart failure.  She is interested  in enrolling in a weight loss or exercise program.   CURRENT MEDICATIONS:  1. Aspirin 81 mg daily.  2. Vytorin 10/20 daily.  3. Carvedilol 12.5 b.i.d.  4. Amlodipine 5 daily.  5. Effexor XR 225 daily.   LABORATORY DATA:  Recent lipid profile, October 2009, notable for total  cholesterol of 155, triglyceride of 175, HDL 48, and LDL 72.   PHYSICAL EXAMINATION:  VITAL SIGNS:  Blood pressure 155/89, pulse 79,  regular, weight 156 (up 6).  GENERAL:  A 60 year old female, moderately obese, sitting upright in no  distress.  HEENT:  Normocephalic, atraumatic.  NECK:  Palpable bilateral pulses without JVD; soft, bilateral bruits.  LUNGS:  Clear to auscultation in all fields.  HEART:  Regular rate and rhythm.  No significant murmurs.  ABDOMEN:  Protuberant, nontender.  EXTREMITIES:  No pedal edema.  NEURO:  No focal deficit.   IMPRESSION:  1. Single-vessel CAD.      a.     NSTEMI/bare metal stenting of CFX, January 2008:  Widely       patent stent by relook catheterization, February 2008.      b.     Hyperdynamic LVF.  2. Hypertension, uncontrolled.  3. Peripheral arterial disease.      a.     History of mild, left renal artery stenosis.  4. Nonobstructive CVD.  5. Dyslipidemia, well-controlled.  6. Question SHRIMP allergy.  7. Obesity.   PLAN:  1. Up titrate  amlodipine to 10 mg daily, for more aggressive blood      pressure control.  With respect to the Coreg, which I had      previously suggested be discontinued when I last saw her, I have      decided to keep this as part of her regimen, given that this has      helped improve her high blood pressure profile over these past few      years.  2. Surveillance carotid Dopplers to rule out any significant      progression, since her last study in July 2008.  3. Return clinic followup in 6 months, or sooner as needed.      Gene Serpe, PA-C  Electronically Signed      Learta Codding, MD,FACC  Electronically Signed   GS/MedQ  DD: 12/23/2008  DT: 12/24/2008  Job #: [id number]

## 2011-03-28 NOTE — Assessment & Plan Note (Signed)
[facility]                          [facility] NOTE   NAME:[name], [name]                 MRN:          [id number]  DATE:09/22/2008                            DOB:          Sep 23, 1951    CARDIOLOGIST:  Learta Codding, MD, [facility]   PRIMARY CARE PHYSICIAN:  Linward Foster   REASON FOR VISIT:  Chest pain.   HISTORY OF PRESENT ILLNESS:  [name] is a 60 year old female patient  with a history of coronary artery disease status post non-ST-elevation  myocardial infarction in January 2008, treated with a bare-metal stent  to the proximal circumflex.  Her last cardiac catheterization in  February 2008 revealed a patent stent in the circumflex.  She continued  to have an 80% ostial stenosis in an acute marginal of the RCA.  This  was a fairly small vessel and treated medically.  The patient has also  been worked up for secondary causes of hypertension to include MRA with  Gadolinium that demonstrated less than 50% stenosis of the left renal  artery.  She also had probable severe ostial SMA stenosis and mild-to-  moderate stenosis of the mid to distal abdominal aorta.  Carotid  Dopplers in July 2008 demonstrated moderate heterogeneous plaque without  significant ICA stenosis.  When last seen in the office in December  2008, she was taken off of her Plavix as it had been almost a year since  her intervention.  There was some discussion about discontinuing her  Coreg in lieu of lisinopril/hydrochlorothiazide 20/25 mg daily.  However, the patient did not discontinue her Coreg.  However, she did  stop her lisinopril.   She recently developed some episodes of chest discomfort.  These were  atypical in nature.  They occurred at rest.  She did have some  substernal heaviness.  She took nitroglycerin x1 each time with prompt  relief.  The second time occurred while she was in a meeting.  The  facility was extremely hot and she became nauseated.   She has had  increased belching recently as well as water brash symptoms.  She  started on Prilosec and has not had recurrence of symptoms since  November 4.  She denies any exertional chest heaviness or tightness.  She denies any syncope or near-syncope.  She denies any orthopnea, PND,  or pedal edema.  She denies any arm or jaw pain, nausea or diaphoresis  associated with her chest pain.   CURRENT MEDICATIONS:  Effexor 75 mg 3 times a day, Vytorin 10/20 mg  daily, Allegra 180 mg daily, Carvedilol 12.5 mg b.i.d., Prilosec 20 mg  daily,  Aspirin 81 mg daily, Multivitamin daily, Xanax p.r.n., Nitroglycerin  p.r.n.   ALLERGIES:  PENICILLIN, SHRIMP.   SOCIAL HISTORY:  She is a nonsmoker.   PHYSICAL EXAMINATION:  GENERAL:  She is a well-nourished, well-developed  female, in no acute distress.  VITAL SIGNS:  Blood pressure is 143/82, pulse 66, weight 150.2 pounds.  HEENT:  Normal.  NECK:  Without JVD.  CARDIAC:  Normal S1 and S2.  Regular rate and rhythm without murmur.  LUNGS:  Clear to  auscultation bilaterally.  ABDOMEN:  Soft and nontender.  EXTREMITIES:  Without edema.  NEUROLOGIC:  She is alert and oriented x3.  Cranial nerves II through  XII grossly intact.  SKIN:  Warm and dry.   Electrocardiogram reveals sinus rhythm with a heart rate of 62, normal  axis, no acute changes.   ASSESSMENT AND PLAN:  1. Atypical chest pain in a 60 year old female with a history of      coronary artery disease status post bare-metal stenting to the      proximal circumflex in January 2008, in the setting of non-ST-      elevation myocardial infarction.  Her last cardiac catheterization      in February 2008, demonstrated a widely patent stent in the      proximal circumflex.  She had residual 80% stenosis in an ostial      acute marginal of the right coronary artery.  This was felt to be a      small vessel and has been treated medically in the past.  She has      overall preserved left  ventricular function with an ejection      fraction of 75% by her last cardiac catheterization.  Her symptoms      were quite atypical and probably more related to gastroesophageal      reflux disease than anything else.  Given her history and the fact      that she is almost 24 months out since her non-ST-elevation      myocardial infarction, I think we should rule out ischemia as a      cause.  Therefore, she will be set up for a stress Cardiolite test.      She will continue on Prilosec and I will bring her back in follow      up in several months.  2. Hypertension.  She is still mildly uncontrolled.  She is no longer      on lisinopril/hydrochlorothiazide.  I think she should remain on a      beta blocker given her history of NSTEMI.  I have recommended that      she start on amlodipine 5 mg a day.  This can be used as an      antianginal in case she is having any symptoms from her acute      marginal stenosis.  3. Dyslipidemia.  She will continue on Vytorin as directed.  Of note,      her recent labs in October 2009 revealed total cholesterol of 155,      triglycerides 175, HDL 48, and LDL 72.  Her LFTs were okay at that      time.  4. Depression.  The patient notes that she has a difficult time      between November and February each year.  Her son, who was born in      November, was murdered in February.  She notes that this time of      year is always difficult for her.  5. Ulcerative colitis.  Seemingly under control.  She will follow up      with Dr. Gerhard Munch and/or her gastroenterologist.   DISPOSITION:  The patient will be brought back in follow up with Dr.  [name] in next 2-3 months or sooner p.r.n.      Tereso Newcomer, PA-C  Electronically Signed      Learta Codding, MD,FACC  Electronically Signed   SW/MedQ  DD: 09/22/2008  DT: 09/22/2008  Job #: [id number]   cc:   Linward Foster

## 2011-03-28 NOTE — Assessment & Plan Note (Signed)
[facility]                          [facility]   NAME:[name], [name]                 MRN:          [id number]  DATE:10/17/2007                            DOB:          [birth year]    PRIMARY CARDIOLOGIST:  Learta Codding, MD,FACC   REASON FOR VISIT:  Scheduled follow-up here.  Please refer to my  previous office note of September 4, for [name] details.   Since that time, the patient was briefly hospitalized here at [facility]  in mid October for treatment of colitis with some associated rectal  bleeding.  Of note, she remained on both Plavix and aspirin through that  time, the former for treatment of a non-ST elevation MI this past  January.   From a cardiac standpoint, the patient continues to do well with no  interim development of signs/symptoms suggestive of unstable angina  pectoris.   She continues to report compliance with her medications, but is  questioning whether or not to continue Plavix.   CURRENT MEDICATIONS:  1. Plavix.  2. Aspirin 162 mg daily.  3. Ciprofloxacin 500 mg q.12 hours.  4. Metronidazole 500 mg q.8 hours.  5. Coreg 12.5 mg b.i.d.  6. Allegra.  7. Vytorin 10/20 mg daily.  8. Lisinopril hydrochlorothiazide 10/12.5 mg daily.  9. Effexor XR 75 mg t.i.d.   LABORATORY DATA:  Total cholesterol 120, triglycerides 243, HDL 36 and  LDL 35, October 2008.   PHYSICAL EXAMINATION:  VITAL SIGNS:  Blood pressure 152/87, pulse 72  regular, weight 150.6 (down 2 pounds).  GENERAL:  A 60 year old female, mildly obese, sitting upright in no  acute distress.  HEENT:  Normocephalic and atraumatic.  NECK:  Palpable bilateral carotid pulses with soft right carotid bruit;  no bruit on the left.  LUNGS:  Clear to auscultation in all fields.  HEART:  Regular rate and rhythm (S1 and S2).  No significant murmurs.  ABDOMEN:  Benign.  EXTREMITIES:  No pedal edema.  NEUROLOGY:  No focal deficits.   IMPRESSION:  1.  Coronary artery disease.      a.     Non-ST elevation myocardial infarction/BMS proximal CFX,       January 2008: widely patent stent site by relook cardiac       catheterization, February 2008.      b.     Hyperdynamic left ventricular function.  2. Peripheral vascular disease.      a.     Mild left renal artery stenosis.      b.     Nonobstructive bilateral ICA stenosis by carotid Dopplers,       July 2008.  3. Dyslipidemia.  4. Hypertension.  5. Question shrimp allergy.   PLAN:  1. Discontinue Plavix.  The patient is now more than 11 months out      from her initial insult with NSTEMI, treated with bare metal      stenting this past January.  Recommendations at that time were to      treat with Plavix for at least one month, and preferably nine      months.  The patient  is to remain on low dose aspirin at 81 mg      daily, indefinitely.  2. Discontinue Coreg.  The patient has no history of cardiomyopathy      and, in fact, had hyperdynamic LVF by cardiac catheterization.  I      would prefer, therefore, that we utilize a much less expensive anti-      hypertensive and will therefore up-titrate lisinopril      hydrochlorothiazide to 20/25 mg daily, for continued blood pressure      management.  3. Check follow-up BMET in one week for monitoring of electrolytes and      renal function.  4. Schedule return clinic follow-up with myself and Dr. [name] in      three months.      Gene Serpe, PA-C  Electronically Signed      Learta Codding, MD,FACC  Electronically Signed   GS/MedQ  DD: 10/17/2007  DT: 10/17/2007  Job #: [id number]

## 2011-03-28 NOTE — Assessment & Plan Note (Signed)
[facility]                          [facility] NOTE   NAME:[name], [name]                 MRN:          [id number]  DATE:05/28/2007                            DOB:          03-Apr-1951    PRIMARY CARDIOLOGIST:  Learta Codding, M.D.   REASON FOR VISIT:  Scheduled six-month followup.   Ms. [name] continues to do quite well from a clinical standpoint since  undergoing intervention and treatment for non-ST elevation myocardial  infarction in January of this year with placement of two bare-metal  stents to the proximal circumflex artery. She was found to essentially  have single-vessel coronary artery disease, although there was also  residual 90% ostial stenosis of a fairly small acute marginal branch of  the right coronary artery. Left ventricular function was initially  deferred secondary to markedly elevated LVEDP. However, in a followup  catheterization approximately one month later for evaluation of  recurrent chest pain (subsequently felt to be secondary to a diet Dr.  Reino Kent drink), the patient had hyperdynamic LVF (EF 74%) but with  increased LVEDP of 28 mmHg. Coronary anatomy showed widely patent stent  sites of the proximal circumflex artery.   The patient has not had any subsequent chest discomfort since the second  cardiac catheterization. However, she reports a recent finding of  elevated blood pressure during scheduled followup with Dr. Celine Mans  approximately three weeks ago. She has not had any adjustment of her  medication regimen since her initial discharge from [facility] in  January.   Electrocardiogram today reveals normal sinus rhythm at 75 BPM with  normal axis and nonspecific ST abnormalities.   CURRENT MEDICATIONS:  1. Plavix.  2. Full-dose aspirin.  3. Vytorin 10/20 daily.  4. Lisinopril/hydrochlorothiazide 10/12.5 daily.  5. Carvedilol 3.125 b.i.d.  6. Effexor XR 75 t.i.d.   PHYSICAL EXAMINATION:   Initial blood pressure 183/102; repeat 178/84  right; 178/80 left. Pulse 88, regular. Weight 152.8.  GENERAL:  A 60 year old female, moderately obese, sitting upright in no  distress.  HEENT:  Normocephalic, atraumatic.  NECK:  Palpable bilateral carotid pulses with soft right carotid bruit  at the base and in the supraclavicular region. No bruit on the left.  LUNGS:  Clear to auscultation in all fields.  HEART:  Regular rate and rhythm (S1/S2). No significant murmurs.  ABDOMEN:  Protuberant, nontender, with intact bowel sounds. There were  bilateral upper quadrant bruits as well as in the left lower quadrant  region.  EXTREMITIES:  Palpable bilateral femoral pulses with bilateral bruits  (left greater than right); palpable dorsalis pedis pulses with trace  pedal edema.  NEUROLOGICAL:  No focal deficits.   IMPRESSION:  1. Coronary artery disease - stable.      a.     Status post non-ST elevation myocardial infarction/bare-       metal stenting proximal circumflex artery, January 2008; widely       patent stent site by relook catheterization February of 2008.      b.     Hyperdynamic left ventricular function.  2. Uncontrolled hypertension.      a.  Question new onset.      b.     Question secondary to renal artery stenosis.  3. Presumed peripheral vascular disease. Right carotid bruit,      abdominal bruit and bilateral femoral bruits.  4. Hyperlipidemia.  5. Questionable shrimp allergy.   PLAN:  1. Up titrate Coreg to 6.25 b.i.d. for more aggressive management of      hypertension. Check followup blood pressure here in the clinic in      two weeks with staff R.N.  2. Check basic metabolic panel to rule out renal insufficiency, given      presentation of hypertension and possible underlying renal artery      stenosis.  3. Down titrate aspirin to target goal of 81 daily.  4. Schedule carotid Dopplers to rule out significant carotid artery      disease.  5. Schedule renal  ultrasound with Dopplers in our [facility] office to      exclude underlying renal artery stenosis, given current      presentation of uncontrolled hypertension.  6. Schedule lower extremity arterial Dopplers to rule out significant      peripheral vascular disease.  7. Schedule return clinic followup with myself and Dr. [name] in the      following 4-6 weeks for review of study results and further      recommendations. Of note, if the renal ultrasound does not clearly      suggest renal artery stenosis, then we may consider a CT angiogram      for more definitive evaluation.      Gene Serpe, PA-C  Electronically Signed      Learta Codding, MD,FACC  Electronically Signed   GS/MedQ  DD: 05/28/2007  DT: 05/29/2007  Job #: [id number]

## 2011-03-28 NOTE — Cardiovascular Report (Signed)
NAME:  [name]                ACCOUNT NO.:  [id number]   MEDICAL RECORD NO.:  [id number]          PATIENT TYPE:  INP   LOCATION:  4703                         FACILITY:  [facility]   PHYSICIAN:  Bevelyn Buckles. Bensimhon, MDDATE OF BIRTH:  13-Aug-1951   DATE OF PROCEDURE:  01/29/2009  DATE OF DISCHARGE:  01/29/2009                            CARDIAC CATHETERIZATION   PATIENT IDENTIFICATION:  [name] is a very pleasant 60 year old  woman with a history of coronary artery disease, status post previous  stenting of the left circumflex in January 2008 with a bare-metal stent.  Due to an episode of resting chest pain yesterday, she was admitted to  the [facility].  She was ruled out for myocardial infarction with  serial cardiac markers.  She is referred for diagnostic angiography.   PROCEDURES PERFORMED:  1. Selective coronary angiography.  2. Left heart catheterization.  3. Left ventriculogram.  4. StarClose femoral artery closure.   DESCRIPTION OF PROCEDURE:  The risks and indications were explained.  Consent was signed and placed on the chart.  A 5-French arterial sheath  was placed in the right femoral artery using a modified Seldinger  technique.  Standard catheters including JL-4, JR-4, and angled pigtail  were used for the procedure.  All catheter exchanges were made over the  wire.  There were no apparent complications.  Central aortic pressure  127/68 with a mean of 92.  LV pressure 134/7 with EDP of 17.  No aortic  stenosis.   Left main was very short, angiographically normal.   LAD was not filled, opacified very well as the catheter subselective to  left circ, and this is a fairly large vessel giving off a diagonal  branch.  In the midsection, there was a 50% lesion in the mid-LAD after  the takeoff of the diagonal branch and in the proximal portion of the  diagonal branch was a 30% lesion.   Left circumflex was a large codominant system with a tiny OM1, a large  OM2, several small posterolaterals and a PDA.  There was stent placed in  proximal and mid AV groove circ, this had about 40-50% in-stent  restenosis seen best in spider shot.  The stent JL to large OM2, in the  ostium of the OM2, there was an 80% focal lesion.  Just after the stent  and mid AV groove circ is a 50-60% narrowing, and in the distal AV  groove circ is a 30% narrowing.   The right coronary artery was a small codominant vessel with mild  luminal irregularities.  There was an 80% lesion in the ostium of the  small RV branch.   Left ventriculogram done in the RAO position showed an EF of 65% with no  regional wall motion abnormalities.   ASSESSMENT:  1. Moderate in-stent restenosis of the left circumflex stent with a JL      to large second obtuse marginal with an 80% ostial lesion.  2. Normal left ventricular function.   PLAN:  I reviewed the films with Dr. Excell Seltzer, the question is whether or  not there is  significant ischemia in the large OM2 branch.  At this  point, we will plan an outpatient Myoview.  If positive for ischemia in  the inferior lateral wall, I will plan percutaneous intervention on left  circumflex in the OM2.  We will discharge her home today and we will set  up the Myoview next week.  We will start her on Plavix.      Bevelyn Buckles. Bensimhon, MD  Electronically Signed     DRB/MEDQ  D:  01/29/2009  T:  01/30/2009  Job:  [id number]

## 2011-03-28 NOTE — Assessment & Plan Note (Signed)
[facility]                          [facility]   NAME:[name], [name]                 MRN:          [id number]  DATE:07/18/2007                            DOB:          03-Jan-1951    PRIMARY CARDIOLOGIST:  Learta Codding, MD,FACC   REASON FOR VISIT:  Scheduled clinic followup. Please refer to my recent  office note of July 15, for full details.   Since last seen in the clinic, the patient reports that she feels much  better. She feels less short of breath and has more energy and  continues to report no chest pain since undergoing percutaneous  intervention for treatment of NSTEMI, this past January.   Aside from up titrating Coreg to the current 6.25 b.i.d., I also  referred her for extensive diagnostic workup to exclude significant  peripheral vascular disease. In summary, the patient has no significant  ICA stenosis by carotid Dopplers, normal ABIs by lower extremity  arterial Dopplers and some mild stenosis of the left renal artery (less  than 50%) by MRI imaging with gadolinium. This latter study was done in  followup of a renal ultrasound which was suggestive of less than 60%  bilateral renal artery stenosis, possibly secondary to tortuous vessels,  as well as probable severe ostial SMA stenosis and mild/moderate  stenosis of the mid-distal abdominal aorta. The study was read by Dr.  Eden Emms in our [facility]. This was to exclude new onset renal  artery stenosis as possible etiology for what I felt to be new onset,  uncontrolled hypertension. The patient also had findings of diffuse  bruits on examination of the abdomen as well as the bilateral groins.   I discussed all of these results with the patient.   CURRENT MEDICATIONS:  1. Full dose aspirin.  2. Vytorin 10/20 daily.  3. Plavix.  4. Lisinopril/hydrochlorothiazide 10/12.5 daily.  5. Coreg 6.25 b.i.d.  6. Effexor 75 t.i.d.   PHYSICAL EXAMINATION:  Blood  pressure 161/92, pulse 71 and regular.  Weight 152.8 unchanged.  GENERAL: A 60 year old female sitting upright in no distress.  HEENT: Normocephalic, atraumatic.  NECK: Palpable carotid pulses with a soft right carotid bruit at the  base/supraclavicular region; no bruit on the left.  LUNGS:  Clear to auscultation in all fields.  HEART: Regular rate and rhythm (S1, S2). No significant murmurs.  ABDOMEN: Benign.  EXTREMITIES: No significant edema.  NEURO: No focal deficit.   IMPRESSION:  1. Hypertension.      a.     Improved following recent up titration of Coreg.  2. Coronary artery disease, stable.      a.     Non-ST elevation myocardial infarction/BMS proximal CFX in       January 2008; widely patent stent site by cardiac catheterization,       February 2008.      b.     Hyperdynamic left ventricular function.  3. Peripheral vascular disease.      a.     Mild left renal artery stenosis.  4. Dyslipidemia.  5. QUESTION SHRIMP ALLERGY.   PLAN:  1. Up titrate Coreg  to 12.5 b.i.d. for continued aggressive management      of hypertension.  2. Check fasting lipid profile and reassess choice of medication,      pending review of test results.  3. Schedule return clinic followup with myself and Dr.  [name] in      three months.      Gene Serpe, PA-C  Electronically Signed      Learta Codding, MD,FACC  Electronically Signed   GS/MedQ  DD: 07/18/2007  DT: 07/18/2007  Job #: [id number]

## 2011-03-31 NOTE — Letter (Signed)
December 31, 2006    Occupational Health   RE:  BRYTNI, DRAY  MRN:  [id number]  /  DOB:  08-Dec-1950   To Whom It May Concern:   Ms. [name] is a 60 year old female with a history of hypertension,  dyslipidemia and coronary artery disease. The patient is status post non  ST elevation myocardial infarction. On November 23, 2006, she underwent  cardiac catheterization which demonstrated proximal circumflex coronary  artery disease and underwent placement of 2 non drug-eluting stents to  the circumflex. She had otherwise mild atherosclerosis managed  medically. She was then followed up in the office and had been doing  well. However, on December 17, 2006, the patient reported substernal  chest pain which in retrospect appeared to be secondary to a diet Dr.  Reino Kent. The patient had reflux and esophageal spasm. She was sent back  for repeat catheterization which demonstrated that her Liberte non drug-  eluting stents were widely patent. She had no additional coronary artery  disease. Her ejection fraction was completely normal.   The patient is doing quite well from a clinical standpoint. She is now  more than 4 weeks after initial myocardial infarction which was single-  vessel disease with normal LV function. The  patient, per my recommendation, can now resume normal physical activity.  She has no specific limitations in regards to her work.   This letter is to confirm the patient is clear from a medical  perspective to resume full activity.    Sincerely,      Learta Codding, MD,FACC  Electronically Signed    GED/MedQ  DD: 12/31/2006  DT: 01/01/2007  Job #: [id number]

## 2011-03-31 NOTE — Assessment & Plan Note (Signed)
[place]                          [facility] NOTE   NAME:[name] [name]                         MRN:          [id number]  DATE:12/13/2006                            DOB:          Sep 28, 1951    REFERRING PHYSICIAN:  Atilano Median, MD   HISTORY OF PRESENT ILLNESS:  The patient is a 60 year old Caucasian  female employed at [employer].  The patient presented with non-ST-  elevation myocardial infarction recently and underwent stent placement  with 2 bare metal stents to the circumflex coronary artery.  Due to an  elevated systolic and diastolic pressure, left ventricular alveolar  lavage was performed and ejection fraction is unknown.  The patient  participated in the [employer] research trial.   The patient presents for followup.  She has been doing well.  She  reports no recurrent chest pain, shortness of breath, orthopnea, or PND.   MEDICATIONS:  1. Estrogen daily.  2. Effexor 75 mg p.o. t.i.d.  3. Vytorin 8/10 mg p.o. daily.  4. Guaifenesin 600/720 daily.  5. Carvedilol 8.125 b.i.d.  6. Lisinopril hydrochlorothiazide 10/12.5 mg p.o. daily.  7. Plavix 75 mg p.o. daily.   PHYSICAL EXAMINATION:  VITAL SIGNS:  Blood pressure 120/68, heart rate  72, weight 146 pounds.  NECK:  Normal carotid upstroke.  No carotid bruits.  LUNGS:  Clear breath sounds bilaterally.  HEART:  Regular rate and rhythm.  Normal S1, S2.  ABDOMEN:  Soft.  EXTREMITIES:  No cyanosis, clubbing, or edema.   PROBLEM LIST:  1. Coronary artery disease.      a.     Status post non-ST-elevation myocardial infarction November 23, 2006.      b.     Status post Liberte non-drug-eluting stent x2 to the       circumflex coronary artery.      c.     Ventriculography was not performed due to elevated systolic       and diastolic pressure.  2. Dyslipidemia.  3. Hypertension.   PLAN:  1. The patient was advised to continue aspirin and Plavix for 1 year.  2. An  echographic study will be requested to evaluate the ejection      fraction.  3. The patient's blood pressure is well-controlled and she can      continue on her current medical regimen.     Learta Codding, MD,FACC  Electronically Signed    GED/MedQ  DD: 12/16/2006  DT: 12/16/2006  Job #: [id number]   cc:   Atilano Median, MD

## 2011-03-31 NOTE — Cardiovascular Report (Signed)
NAME:  [name], [name] NO.:  [id number]   MEDICAL RECORD NO.:  [id number]          PATIENT TYPE:  INP   LOCATION:  1843                         FACILITY:  [facility]   PHYSICIAN:  Salvadore Farber, MD  DATE OF BIRTH:  30-Jul-1951   DATE OF PROCEDURE:  11/23/2006  DATE OF DISCHARGE:                            CARDIAC CATHETERIZATION   PROCEDURE:  Left heart catheterization, coronary angiography, bare metal  stent placement x2 to the proximal circumflex, Starclose right common  femoral arteriotomy site.   INDICATIONS:  Ms. Heiny is a 60 year old woman without prior history  of coronary artery disease who presents with non-ST elevation myocardial  infarction.  Pain onset was yesterday with exertion.  She subsequently  had rest pain this morning.  She is currently pain free.  Troponin was  elevated at 0.6 on first check.  Repeat is pending.  She has not had  clinical heart failure.  She is referred for diagnostic angiography and  possible percutaneous intervention.   PROCEDURE TECHNIQUE:  Informed consent was obtained.  Under 1% lidocaine  local anesthesia, a 5-French sheath was placed in the right common  femoral artery using modified Seldinger technique.  Diagnostic  angiography was performed using JL-4 and JR-4 catheters.  Left heart  catheterization was performed using a pigtail catheter.  Left  ventricular end diastolic pressure was found to be 35 mmHg.  For that  reason, left ventriculography was not performed.   Images demonstrated the culprit lesion to be a 99% stenosis of the  proximal circumflex.  We decided proceed to percutaneous  revascularization.   Anticoagulation was initiated with bivalirudin.  The patient had  consented to participate in the CHAMPION study comparing two methods of  platelet inhibition during percutaneous intervention.  The CHAMPION  study drug was administered per randomization status.  The sheath was  upsized over a wire to  6-French.  A 6-French ACLS 3.5 guide was advanced  over a wire and engaged in the ostium of the circumflex (there is no  left main).  A Prowater wire was advanced to the distal circumflex  without difficulty.  The lesion was predilated using a 3 x 9 mm Maverick  at 6 atmospheres.  I then advanced a 3.5 x 12 mm Liberte stent across  the lesion.  Additional angiography demonstrated that there had been  some plaque shift to just beyond the origin of a large marginal,  approximately 5 mm distal to the original lesion.  Covering this  necessitated a longer stent.  I, therefore, removed the 12 mm stent and  advanced a 3 x 16 mm Liberte stent across the lesion.  I deployed it at  14 atmospheres.  I then post dilated the stent using a 3.25 x 12 mm  Quantum at 14 atmospheres distally and then a 3.5 x 12 mm Quantum at 16  atmospheres proximally.  Repeat angiography demonstrated proximal edge  dissection.  There was normal flow.  However, given the depth of the  edge dissection, I decided to treat with an additional stent.  To that  end, I advanced the  3.5 x 12 mm Liberte stent across the dissected  segment and positioned it so as to overlap the proximal edge of the  previously placed stent by approximately 2 mm.  I deployed it at 16  atmospheres.  I then post dilated the region of overlap using a 3.5 x 12  mm Quantum at 16 atmospheres.  Final angiography demonstrated no  residual stenosis, no dissection, and TIMI III flow to the distal  vasculature.   The arteriotomy was then closed using a Starclose device.  Complete  hemostasis was obtained.  The patient was then transferred to the  holding room in stable condition having tolerated the procedure well.   COMPLICATIONS:  None.   FINDINGS:  1. LV:  133/13/35.  2. No aortic stenosis on pullback.  3. Left ventriculography:  This was deferred due to markedly elevated      left ventricular end diastolic pressure.  4. Left main:  There is no left  main.  5. LAD:  A fairly large vessel which wraps the apex of the heart.  It      gives rise to a moderate sized diagonal.  The proximal LAD is      heavily calcified.  There is diffuse 30% stenosis.  There is then a      focal 50% stenosis just after the origin of the diagonal.  6. Circumflex:  Fairly large codominant vessel.  There was a 99%      stenosis proximally and a 60% stenosis just after the marginal      which were stented using two overlapping bare metal stents.  Flow      improved from TIMI II to TIMI III.  7. RCA:  Relatively small, codominant vessel.  There is a 30% stenosis      in the mid vessel just after the origin of acute marginal.  The      acute marginal has a 90% ostial stenosis.  It is a fairly small      vessel.   RECOMMENDATIONS:  Successful percutaneous revascularization of the  proximal circumflex.  A small non flow limiting proximal edge dissection  required an additional overlapping stent.  There was an excellent final  result.  Her left ventricular end diastolic pressure is markedly  elevated.  I, therefore, did not perform ventriculography.  We will plan  on continuation of beta blocker in addition to ACE inhibitor.  We will  continue aspirin indefinitely.  Plavix should be continued for a minimum  of 30 days and preferably nine months given the acute presentation.  She  will need echocardiogram to assess left ventricular systolic function.      Salvadore Farber, MD  Electronically Signed     WED/MEDQ  D:  11/23/2006  T:  11/24/2006  Job:  [id number]

## 2011-03-31 NOTE — Discharge Summary (Signed)
NAME:  [name], [name] NO.:  [id number]   MEDICAL RECORD NO.:  [id number]          PATIENT TYPE:  INP   LOCATION:  2020                         FACILITY:  [facility]   PHYSICIAN:  Jonelle Sidle, MD DATE OF BIRTH:  10-26-1951   DATE OF ADMISSION:  12/16/2006  DATE OF DISCHARGE:  12/17/2006                               DISCHARGE SUMMARY   PRIMARY CARDIOLOGIST:  Learta Codding, MD, [facility]   PRIMARY CARE PHYSICIAN:  Dr. Celine Mans in [place], [place].   PRINCIPAL DIAGNOSIS:  Chest pain.   SECONDARY DIAGNOSES:  1. Coronary artery disease.      a.     Status post non-ST-elevation myocardial infarction on       January 11,2008 with catheterization revealing 99% proximal       stenosis in the left circumflex, stented with 3.5 x 12-mm bare       metal stents x2.  2. Hypertension.  3. Hyperlipidemia.   ALLERGIES:  SHRIMP.   HISTORY OF PRESENT ILLNESS:  60 year old Caucasian female status post  non-ST-elevation MI in January of 2008, as outlined above, who was  discharged from [facility] on January 13,2008.  She had been doing well  and was in her usual state of health until the morning of December 16, 2006 when shortly after drinking a diet Dr. Reino Kent she had 2/10 focal  chest discomfort that lasted just a couple of seconds.  She was advised  by a physician friend to sit and take her nitroglycerin which she did.  EMS was then called, and she was taken to [facility].  At  [facility], her ECG was without acute changes, and her CK-MB was elevated  at 9.9 with a normal troponin.  Decision was made to transfer her to  [facility] for further evaluation and catheterization.   HOSPITAL COURSE:  The patient arrived at [facility] on February3, 2008  and was painfree.  She was seen by Dr. Eden Emms and scheduled for cardiac  catheterization.  She underwent left heart cardiac catheterization on  December 17, 2006, revealing patent stents in the left circumflex and  otherwise  unchanged coronary anatomy from catheterization on November 23, 2006.  EF was 75% without regional wall motion abnormalities.  She will  be discharged home this afternoon in satisfactory condition.   DISCHARGE LABORATORY DATA:  Hemoglobin 13.4, hematocrit 38.2, WBC 9.0,  platelets 274, MCV 96.3.  Sodium 133, potassium 4.2, chloride 100, CO2  23, BUN 13, creatinine 0.76, glucose 171.  PT 13.9, INR 1.1, PTT 79,  total bilirubin 0.8, alkaline phosphatase 35, AST 32, ALT 29, albumin  3.7, CK 233, MB 8.6, troponin I 0.01, total cholesterol 87,  triglycerides 70, HDL 41, LDL 32, calcium 8.9.  TSH pending.   DISPOSITION:  The patient is being discharged home today in good  condition.   FOLLOWUP PLANS AND APPOINTMENTS:  She has a followup appointment with  Dr. [name] in [place] on January 02, 2007 at 3:00 p.m.  She is asked to  follow up with Dr. Desai[facility] as previously scheduled.  DISCHARGE MEDICATIONS:  1. Aspirin 325 mg daily.  2. Plavix 75 mg daily.  3. Lisinopril and hydrochlorothiazide 10/12.5 mg daily.  4. Vytorin 80/10 mg q.h.s.  5. Coreg 3.125 mg b.i.d.  6. Effexor XR 225 mg daily.  7. Nitroglycerin 0.4 mg sublingual p.r.n. chest pain.   OUTSTANDING LAB STUDIES:  None.   DURATION OF DISCHARGE ENCOUNTER:  40 minutes, including physician time.      Nicolasa Ducking, ANP      Jonelle Sidle, MD  Electronically Signed    CB/MEDQ  D:  12/17/2006  T:  12/17/2006  Job:  [id number]

## 2011-03-31 NOTE — Discharge Summary (Signed)
[name], [name]                ACCOUNT NO.:  [id number]   MEDICAL RECORD NO.:  [id number]          PATIENT TYPE:  INP   LOCATION:  4706                         FACILITY:  [facility]   PHYSICIAN:  Doylene Canning. Ladona Ridgel, MD    DATE OF BIRTH:  [birth year]   DATE OF ADMISSION:  11/23/2006  DATE OF DISCHARGE:  11/25/2006                               DISCHARGE SUMMARY   PRIMARY CARDIOLOGIST:  Learta Codding, MD,FACC   PRIMARY CARDIOLOGIST:  Dr. Nicoletta Ba in [place], [place].   DISCHARGE DIAGNOSES:  1. Coronary artery disease status post non-ST elevated myocardial      infarction.  The patient underwent cardiac catheterization with a      PCI to the proximal circumflex with placement of bare metal stents      x2 by Dr. Samule Ohm on November 23, 2006.  The patient was found to      have a markedly elevated left ventricular end diastolic pressure.      Dr. Samule Ohm did not perform ventriculography.  The patient will need      an echocardiogram to assess left ventricular systolic function.      This will be done outpatient.  2. Participant in the Rentz study research drug.  3. Hyperlipidemia.  Patient already on Vytorin.  4. Hypertension.   HOSPITAL COURSE:  Ms.. [name] is a very pleasant 60 year old Caucasian  female employed at [employer].  She has no prior history of  cardiac disease.  She presented to the emergency room at [facility]  complaining of chest discomfort.  Dr. [name] saw the patient in  consultation secondary to abnormal troponin.  The patient was treated  with aspirin, Lopressor and nitroglycerin.  EKG showed normal sinus  rhythm with slight ST-segment depression in the inferior lateral leads.  Troponin was elevated at 0.56.  The patient was transferred directly to  [facility] for cardiac catheterization.  She was treated  prophylactically for a possible allergy to shrimp.  Cath lab results as  stated above.  The patient tolerated her procedure without  complications.   Prepared for discharge home on day 3 of hospitalization.  The patient has received cardiac rehabilitation education and is to be  followed by the cardiac research team regarding Alla Feeling study.  At time  of discharge, the patient is afebrile, blood pressure 136/60, heart rate  60s to 80s.  Hemoglobin and hematocrit of 13.1 and 37.9.  WBCs 19.5 and  platelets 289,000.  Chemistries - sodium 141, potassium 3.9, BUN 15,  creatinine 0.91.  Note - it is felt the patient's WBCs were elevated  secondary to a steroid treatment for a shrimp allergy.  Total  cholesterol 126, triglycerides 90, HDL 44, LDL 64.  Cath site stable to  right groin.  The patient is being discharged home to follow up with Dr.  [name]  within 2 weeks.  The patient agrees to call the office at  [facility] at [phone] to schedule an appointment.   MEDICATIONS AT DISCHARGE:  1. Aspirin 325.  2. Plavix 75.  3. Effexor as previously prescribed.  4. Lisinopril/HCTZ 10/12.5  mg daily.  5. Vytorin 80/10 daily.  6. Coreg 3.125 mg b.i.d. which is a new medication for the patient      also.  7. Nitroglycerin as needed.   The patient was given post cardiac catheterization discharge  instructions.  She can return to work December 03, 2006.  No lifting for  1 week.  No driving for 5 days.  She has also received the post  myocardial infarction contract instructions regarding activity, care,  nitroglycerin, medications, follow-up echocardiogram.  The patient will  need follow-up CBC to make sure WBC count is trending downward.  She  agrees to call our office should she have any problems from the cath  site.   DURATION OF DISCHARGE ENCOUNTER:  Thirty minutes.      [name], ACNP      Doylene Canning. Ladona Ridgel, MD  Electronically Signed    MB/MEDQ  D:  11/25/2006  T:  11/25/2006  Job:  [id number]   cc:   Learta Codding, MD,FACC  Dr. Nicoletta Ba

## 2011-03-31 NOTE — Cardiovascular Report (Signed)
NAME:  [name], [name] NO.:  [id number]   MEDICAL RECORD NO.:  [id number]          PATIENT TYPE:  INP   LOCATION:  2020                         FACILITY:  [facility]   PHYSICIAN:  Jonelle Sidle, MD DATE OF BIRTH:  [birth year]   DATE OF PROCEDURE:  12/17/2006  DATE OF DISCHARGE:                            CARDIAC CATHETERIZATION   PRIMARY CARDIOLOGIST:  Dr. [name]   REQUESTING CARDIOLOGIST:  Noralyn Pick. Eden Emms, MD, [facility]   INDICATIONS:  [name] is a 60 year old woman with a history of  hypertension, hyperlipidemia, and coronary artery disease status post  recent non-ST elevation myocardial infarction back in mid January.  She  underwent cardiac catheterization on January 11 which demonstrated  severe proximal circumflex disease and underwent placement of two non  drug eluting stents.  Otherwise she had mild atherosclerosis and has  been managed medically.  She was transferred from [facility] following presentation with recurrent chest pain and has ruled  out for myocardial infarction.  This study is requested to reevaluate  the coronary anatomy.  The potential risks and benefits were explained  to her in advance and informed consent was obtained.   PROCEDURE PERFORMED:  1. Left heart catheterization.  2. Selective coronary angiography.  3. Left ventriculography.   ACCESS AND EQUIPMENT:  The area about the right femoral artery was  anesthetized 1% lidocaine.  Using a Doppler needle, a 6-French sheath  was placed in the right femoral artery via the modified Seldinger  technique.  A standard preformed 6-French JL-4 catheter was used for  selective coronary angiography the left system followed by a 5-French no-  torque right catheter for angiography of the right system.  A standard 6-  French angled pigtail catheter was used for left heart catheterization  and left ventriculography.  All exchanges were made over wire.  Total of  90 mL Omnipaque  were used.  The patient tolerated procedure well without  immediate complications.   HEMODYNAMIC RESULTS:  Aorta 143/74 mmHg, left ventricle 140/28 mmHg.   ANGIOGRAPHIC FINDINGS:  1. There is a very short left main, essentially nearby separate ostia      of the left anterior descending and circumflex coronary arteries.  2. The left anterior descending is a medium to small caliber vessel      with distal tapering and 30% midvessel stenosis.  There is      essentially one medium-sized branching diagonal.  No flow-limiting      stenoses are noted.  3. The circumflex coronary artery is a medium to large caliber vessel      that is codominant with a small right coronary artery.  Stent site      is visualized from the proximal portion of the vessel and was      widely patent.  Otherwise there are minor luminal irregularities.      There is a ramus intermedius and a large multi-branched obtuse      marginal in the midvessel segment.  4. The right coronary artery is small and codominant.  There is an  acute marginal visualized with approximately 80% ostial stenosis      which is not a new finding.  Otherwise minor luminal irregularities      are noted.  5. Left ventriculography is performed in the RAO projection and      reveals an ejection fraction approximately 75% with no focal      anterior or inferior wall motion abnormality and trace mitral      regurgitation in the setting of ventricular ectopy.   DIAGNOSIS:  1. Mild coronary atherosclerosis as discussed above with widely patent      stent site within the proximal circumflex.  2. Left ventricular ejection fraction approximately 75% with an      increased left ventricular end-diastolic pressure of 28 mmHg, trace      mitral regurgitation in the setting of ventricular ectopy, and no      significant pullback gradient.   DISCUSSION:  I reviewed the results with the patient and with Dr. [name]  by phone.  At this point I would  anticipate continued medical therapy.  Discharge will be planned for later today with follow-up in the [facility]  office with Dr. [name].      Jonelle Sidle, MD  Electronically Signed     SGM/MEDQ  D:  12/17/2006  T:  12/17/2006  Job:  [id number]   cc:   Learta Codding, MD,FACC  Noralyn Pick Eden Emms, MD, [facility]

## 2011-03-31 NOTE — Discharge Summary (Signed)
NAME:  [name], WEILBACHER                ACCOUNT NO.:  [id number]   MEDICAL RECORD NO.:  [id number]          PATIENT TYPE:  INP   LOCATION:  2020                         FACILITY:  [facility]   PHYSICIAN:  Noralyn Pick. Eden Emms, MD, FACCDATE OF BIRTH:  [birth year]   DATE OF ADMISSION:  12/16/2006  DATE OF DISCHARGE:                               DISCHARGE SUMMARY   PRIMARY CARDIOLOGIST:  Dr. [name] in [place]   PRIMARY CARE PHYSICIAN:  Dr. Celine Mans In [place].   PATIENT PROFILE:  A 60 year old Caucasian female with recent non-ST  elevation MI in January 2008 status post stenting to the left circumflex  x2 who presents with chest pain.   PROBLEMS:  1. Chest pain/coronary artery disease.      a.     Status post non-ST elevation MI January 2008.      b.     November 23, 2006 cardiac catheterization LAD with minor       irregularities left circumflex 99% proximal, 60% mid.  This area       was stented with overlapping 3.5 x 12 mm Liberte bare metal       stents.  RCA 30% lesion to the mid acute marginal at a 90%       osteostenosis.  2. Hyperlipidemia.  3. Hypertension.  4. Overweight.   HISTORY OF PRESENT ILLNESS:  A 60 year old Caucasian female status not-  ST elevation November 23, 2006 with stenting of the circumflex with bare  metal stents x2.  She was discharged from [facility] November 25, 2006.  She did well and followed with Dr. [name], undergoing 2D echocardiogram  recently.  Those results are not currently available.  She had been  doing well and was getting ready to start exercising in the coming week.  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 Reino Kent and subsequently developed a brief episode 2/10 focal  chest twinge and felt maybe a little hot.  She belched after just a  couple of seconds and the twinge resolved.  She told her Sunday school  teaching partner, Dr. Donzetta Sprung who advised her that she sit down and  take a nitroglycerin which she did.  They  then proceeded to call EMS and  she was taken to [facility].  At [facility], her ECG was without  acute changes and her CK was elevated at 288, with an MB of 9.9 with a  normal troponin at 0.02.  Decision was made to transfer to [facility]  for further evaluation.  She is currently pain free.   ALLERGIES:  She has a shrimp gallery.   HOME MEDICATIONS:  1. Aspirin 325 mg daily.  2. Plavix 75 mg daily.  3. Effexor XR 225 mg daily.  4. Lisinopril/HCTZ 10/12.5 mg daily.  5. Vytorin 80/10 mg daily.  6. Coreg 3.125 mg b.i.d.  7. Nitroglycerin p.r.n.  8. She is currently on a heparin infusion as well.   FAMILY HISTORY:  Mother died of cirrhosis at 56, father died of COPD at  6.  She has  no siblings.   SOCIAL HISTORY:  [place].  She works at [employer] as a Sports coach.  She had one child  who was murdered approximately 3 years ago.  She denies any tobacco or  drug use.  She occasionally will drink a light beer.  She had been  planning on exercises but is not doing it yet.   REVIEW OF SYSTEMS:  Positive for chest pain as outlined in the HPI.  Otherwise all other systems reviewed and are negative.   PHYSICAL EXAMINATION:  A pleasant white female in no acute distress.  Awake, alert and oriented x3.  NECK:  Normal.  Carotid upstrokes brisk, no JVD.  LUNGS:  Respirations regular and unlabored. Clear to auscultation.  CARDIAC:  Regular S1, S2.  No S3, S4 or murmurs.  ABDOMEN:  Obese, soft, nontender, nondistended.  Bowel sounds present  x4.  SKIN:  Warm and dry, pink.  No clubbing, cyanosis or edema.  Dorsalis  pedis, posterior tibial pulses 2+ and equal bilaterally. There are no  femoral bruits.   Chest x-ray is pending.  EKG shows sinus rhythm with a normal axis.  Rate is 67 beats per minute.  T-wave inversion in lead 3 with T-wave  flattening in AVF.   LABORATORY DATA:  Hemoglobin 12.8, hematocrit 36.1, WBC 8.6,  platelet  287, sodium 132, potassium 3.6, chloride 97, CO2 27, BUN 17, creatinine  0.80, glucose 101, BNP 82, CK 288, MB 9.9, troponin I 0.02, calcium 8.7.   ASSESSMENT/PLAN:  1. Chest pain, somewhat atypical as she describes it as being brief      and focal, improved with belching, however her MB is elevated at      9.9 with a normal troponin.  We will continue to cycle cardiac      enzymes and likely plan a cardiac catheterization in the a.m.      Continue aspirin, beta blocker, ACE inhibitors and Statin.  2. Hypertension, continue beta blocker and ACE inhibitor.  3. Hyperlipidemia, check lipid panel, continue Statin.      Nicolasa Ducking, ANP      Noralyn Pick. Eden Emms, MD, [facility]  Electronically Signed    CB/MEDQ  D:  12/16/2006  T:  12/17/2006  Job:  [id number]

## 2011-04-21 ENCOUNTER — Other Ambulatory Visit: Payer: Self-pay | Admitting: Cardiology

## 2011-06-05 ENCOUNTER — Other Ambulatory Visit: Payer: Self-pay | Admitting: Cardiology

## 2011-06-15 ENCOUNTER — Encounter: Payer: Self-pay | Admitting: Cardiology

## 2011-06-28 ENCOUNTER — Encounter: Payer: Self-pay | Admitting: *Deleted

## 2011-06-28 ENCOUNTER — Encounter: Payer: Self-pay | Admitting: Cardiology

## 2011-06-28 ENCOUNTER — Ambulatory Visit (INDEPENDENT_AMBULATORY_CARE_PROVIDER_SITE_OTHER): Payer: 59 | Admitting: Physician Assistant

## 2011-06-28 DIAGNOSIS — E785 Hyperlipidemia, unspecified: Secondary | ICD-10-CM

## 2011-06-28 DIAGNOSIS — E782 Mixed hyperlipidemia: Secondary | ICD-10-CM

## 2011-06-28 DIAGNOSIS — R079 Chest pain, unspecified: Secondary | ICD-10-CM | POA: Insufficient documentation

## 2011-06-28 DIAGNOSIS — I2510 Atherosclerotic heart disease of native coronary artery without angina pectoris: Secondary | ICD-10-CM

## 2011-06-28 DIAGNOSIS — R0989 Other specified symptoms and signs involving the circulatory and respiratory systems: Secondary | ICD-10-CM | POA: Insufficient documentation

## 2011-06-28 MED ORDER — ISOSORBIDE MONONITRATE ER 30 MG PO TB24: 30.0000 mg | ORAL_TABLET | Freq: Every day | ORAL | Status: DC

## 2011-06-28 MED ORDER — NITROGLYCERIN 0.4 MG SL SUBL: 0.4000 mg | SUBLINGUAL_TABLET | SUBLINGUAL | Status: DC | PRN

## 2011-06-28 NOTE — Assessment & Plan Note (Signed)
Will evaluate further with an exercise stress Cardiolite to rule out ischemia. Her last study was in 2010, and was interpreted as a low risk study; EF 60%. If this is negative for ischemia, then continued medical therapy is recommended. Will schedule return visit with me in one month, for review of test results and further recommendations.

## 2011-06-28 NOTE — Assessment & Plan Note (Signed)
We'll further evaluate with carotid Dopplers. Previously studied, 2/10, indicating moderate bilateral ICA disease, with moderate/high-grade bilateral ECA stenoses.

## 2011-06-28 NOTE — Assessment & Plan Note (Signed)
We'll reassess with an FLP/LFT profile. Target LDL 70 or less, if feasible.

## 2011-06-28 NOTE — Patient Instructions (Signed)
   Exercise stress cardiolite - do not hold meds  Carotid dopplers   Labs:  FLP & LFT If the results of your test are normal or stable, you will receive a letter.  If they are abnormal, the nurse will contact you by phone. Follow up in  1 month

## 2011-06-28 NOTE — Progress Notes (Signed)
HPI: patient presents for scheduled 6 month followup.  Reports singular episode of severe CP while swimming, exactly one month ago. This developed approximately 3/4 of the way across the length of the pool. She rated it as 9/10, described as sharp, with no associated radiation. She took one NTG tablet, with relief in less than 5 minutes. She denies any recurrent symptoms, and states that it's been several years since she had CP. She also noted that this recent episode was dissimilar from prior presentations. She denied any exacerbation with deep inspiration/movement of the torso or arms. She feels that she may have pulled a muscle in her  Allergies  Allergen Reactions  . Penicillins     REACTION: ?rash    Current Outpatient Prescriptions on File Prior to Visit  Medication Sig Dispense Refill  . ALPRAZolam (XANAX) 0.25 MG tablet Take 0.25 mg by mouth 2 (two) times daily as needed.        Marland Kitchen aspirin 81 MG EC tablet Take 81 mg by mouth daily.        . carvedilol (COREG) 12.5 MG tablet Take 12.5 mg by mouth 2 (two) times daily.        Marland Kitchen ezetimibe-simvastatin (VYTORIN) 10-20 MG per tablet Take 1 tablet by mouth daily.       . fexofenadine (ALLEGRA) 180 MG tablet Take 180 mg by mouth as needed.        . hydrochlorothiazide 25 MG tablet TAKE 1 TABLET ONCE DAILY  30 tablet  6  . isosorbide mononitrate (IMDUR) 30 MG 24 hr tablet Take 30 mg by mouth daily.        . Multiple Vitamins-Minerals (ONE-A-DAY EXTRAS ANTIOXIDANT) CAPS Take 1 capsule by mouth daily.        . Multiple Vitamins-Minerals (PRESERVISION/LUTEIN) CAPS Take 1 capsule by mouth 2 (two) times daily.        . nitroGLYCERIN (NITROSTAT) 0.4 MG SL tablet Place 0.4 mg under the tongue every 5 (five) minutes as needed.        . NORVASC 10 MG tablet TAKE 1 TABLET ONCE DAILY  30 each  6  . PLAVIX 75 MG tablet TAKE 1 TABLET ONCE DAILY  30 each  6  . potassium chloride SA (K-DUR,KLOR-CON) 20 MEQ tablet Take 20 mEq by mouth 2 (two) times daily.         . traZODone (DESYREL) 50 MG tablet Take 50 mg by mouth daily as needed.       . venlafaxine (EFFEXOR-XR) 75 MG 24 hr capsule Take 225 mg by mouth daily.          Past Medical History  Diagnosis Date  . CAD (coronary artery disease)     s/p NST EMI/bare-metal stenting of proximal CFX, 1/08: widely patent stent site by a relook cardiac cath, 2/08. hyperdynamic LV function. jailed OM branch approx. 80% stenosis  . PAD (peripheral artery disease)     mild left renal artery stenosis; severe osital SMA stenosis  . CVD (cerebrovascular disease)     non obstructive  . Dyslipidemia   . HTN (hypertension)   . Anxiety and depression   . Colitis     Past Surgical History  Procedure Date  . Abdominal hysterectomy     abd?    History   Social History  . Marital Status: Married    Spouse Name: N/A    Number of Children: N/A  . Years of Education: N/A   Occupational History  . Not on file.  Social History Main Topics  . Smoking status: Never Smoker   . Smokeless tobacco: Never Used   Comment: tobacco use - no  . Alcohol Use: Not on file  . Drug Use: Not on file  . Sexually Active: Not on file   Other Topics Concern  . Not on file   Social History Narrative  . No narrative on file    Family History  Problem Relation Age of Onset  . Diabetes Neg Hx   . Hypertension Neg Hx   . Coronary artery disease Neg Hx     ROS: Negative for exertional chest pain, DOE, orthopnea, PND, lower extremity edema, palpitations, presyncope/syncope, claudication, reflux, hematuria, hematochezia, or melena. Remaining systems reviewed, and are negative.   PHYSICAL EXAM:  BP 146/78  Pulse 87  Ht 4\' 11"  (1.499 m)  Wt 153 lb (69.4 kg)  BMI 30.90 kg/m2  GENERAL: well-nourished, well-developed; NAD HEENT: NCAT, PERRLA, EOMI; sclera clear; no xanthelasma NECK: bilateral carotid bruits; no JVD; no TM LUNGS: CTA bilaterally CARDIAC: RRR (S1, S2); no significant murmurs; no rubs or  gallops ABDOMEN: soft, non-tender; intact BS EXTREMETIES: intact distal pulses; no significant peripheral edema SKIN: warm/dry; no obvious rash/lesions MUSCULOSKELETAL: no joint deformity NEURO: no focal deficit; NL affect   EKG:    ASSESSMENT & PLAN:

## 2011-06-29 ENCOUNTER — Telehealth: Payer: Self-pay | Admitting: *Deleted

## 2011-06-29 ENCOUNTER — Other Ambulatory Visit: Payer: Self-pay | Admitting: Cardiology

## 2011-06-29 DIAGNOSIS — R0989 Other specified symptoms and signs involving the circulatory and respiratory systems: Secondary | ICD-10-CM

## 2011-06-29 DIAGNOSIS — I2510 Atherosclerotic heart disease of native coronary artery without angina pectoris: Secondary | ICD-10-CM

## 2011-06-29 NOTE — Telephone Encounter (Signed)
Exercise stress cardiolite - do not hold meds ON 8-24 @  [facility].

## 2011-06-30 ENCOUNTER — Encounter: Payer: Self-pay | Admitting: *Deleted

## 2011-06-30 NOTE — Telephone Encounter (Signed)
No precert required per pt's health plan 

## 2011-07-04 ENCOUNTER — Other Ambulatory Visit: Payer: Self-pay | Admitting: Physician Assistant

## 2011-07-04 DIAGNOSIS — R0989 Other specified symptoms and signs involving the circulatory and respiratory systems: Secondary | ICD-10-CM

## 2011-07-05 ENCOUNTER — Encounter (INDEPENDENT_AMBULATORY_CARE_PROVIDER_SITE_OTHER): Payer: 59 | Admitting: *Deleted

## 2011-07-05 DIAGNOSIS — I6529 Occlusion and stenosis of unspecified carotid artery: Secondary | ICD-10-CM

## 2011-07-05 DIAGNOSIS — R0989 Other specified symptoms and signs involving the circulatory and respiratory systems: Secondary | ICD-10-CM

## 2011-07-07 DIAGNOSIS — R079 Chest pain, unspecified: Secondary | ICD-10-CM

## 2011-07-12 ENCOUNTER — Telehealth: Payer: Self-pay | Admitting: *Deleted

## 2011-07-12 NOTE — Telephone Encounter (Signed)
Left message to return call 

## 2011-07-12 NOTE — Telephone Encounter (Signed)
Message copied by Murriel Hopper on Wed Jul 12, 2011 11:31 AM ------      Message from: Lewayne Bunting E      Created: Thu Jul 06, 2011  4:49 PM       Lipid panel at goal. Mild elevation in SGOT only - f/u labs in 3 months.

## 2011-07-13 NOTE — Telephone Encounter (Signed)
Patient notified of below.   

## 2011-07-19 ENCOUNTER — Telehealth: Payer: Self-pay | Admitting: *Deleted

## 2011-07-19 ENCOUNTER — Encounter: Payer: Self-pay | Admitting: *Deleted

## 2011-07-19 NOTE — Telephone Encounter (Signed)
Left message for patient to call office.  

## 2011-07-19 NOTE — Telephone Encounter (Signed)
Message copied by Eustace Moore on Wed Jul 19, 2011  4:33 PM ------      Message from: Lewayne Bunting E      Created: Thu Jul 13, 2011  1:23 PM       Moderate disease a left side only. Followup study in one year

## 2011-07-20 NOTE — Progress Notes (Signed)
This encounter was created in error - please disregard.

## 2011-07-20 NOTE — Telephone Encounter (Signed)
Patient informed by Gayle Hill. 

## 2011-08-04 ENCOUNTER — Ambulatory Visit (INDEPENDENT_AMBULATORY_CARE_PROVIDER_SITE_OTHER): Payer: 59 | Admitting: Cardiology

## 2011-08-04 ENCOUNTER — Encounter: Payer: Self-pay | Admitting: Cardiology

## 2011-08-04 VITALS — BP 125/72 | HR 80 | Ht 59.0 in | Wt 149.0 lb

## 2011-08-04 DIAGNOSIS — R0989 Other specified symptoms and signs involving the circulatory and respiratory systems: Secondary | ICD-10-CM

## 2011-08-04 DIAGNOSIS — E785 Hyperlipidemia, unspecified: Secondary | ICD-10-CM

## 2011-08-04 DIAGNOSIS — I259 Chronic ischemic heart disease, unspecified: Secondary | ICD-10-CM

## 2011-08-04 MED ORDER — EZETIMIBE-SIMVASTATIN 10-20 MG PO TABS: 1.0000 | ORAL_TABLET | Freq: Every day | ORAL | Status: DC

## 2011-08-04 NOTE — Patient Instructions (Signed)
Continue all current medications. Your physician wants you to follow up in: 6 months.  You will receive a reminder letter in the mail one-two months in advance.  If you don't receive a letter, please call our office to schedule the follow up appointment   

## 2011-08-07 NOTE — Assessment & Plan Note (Signed)
Followed by the patient's primary care physician 

## 2011-08-07 NOTE — Assessment & Plan Note (Signed)
Peripheral vascular disease: Stable carotid artery disease with followup Dopplers in one year.

## 2011-08-07 NOTE — Progress Notes (Signed)
HPI The patient is a 60 year old female with history of coronary artery disease status post stent placement to the proximal and circumflex coronary artery and renal cardiac catheterization in 2008. She also has peripheral arterial disease with severe ostial SMA stenosis and nonobstructive carotid artery disease. She had recent Dopplers done which showed stable lesions. She also had a Lexis scan done because the patient reported chest pain after swimming three quarters of the way across the length of the pool. She reported a sharp chest pain 9/10. Lexa scan was within normal limits with no ischemia. Ejection fraction was within normal limits. The patient presents for routine followup and discuss the results. Her chest pain has resolved she denies any shortness of breath orthopnea PND she has no palpitations or syncope.  Allergies  Allergen Reactions  . Penicillins     REACTION: ?rash    Current Outpatient Prescriptions on File Prior to Visit  Medication Sig Dispense Refill  . ALPRAZolam (XANAX) 0.25 MG tablet Take 0.25 mg by mouth 2 (two) times daily as needed.        Marland Kitchen aspirin 81 MG EC tablet Take 81 mg by mouth daily.        . carvedilol (COREG) 12.5 MG tablet Take 12.5 mg by mouth 2 (two) times daily.        . fexofenadine (ALLEGRA) 180 MG tablet Take 180 mg by mouth as needed.        . hydrochlorothiazide 25 MG tablet TAKE 1 TABLET ONCE DAILY  30 tablet  6  . isosorbide mononitrate (IMDUR) 30 MG 24 hr tablet Take 1 tablet (30 mg total) by mouth daily.  30 tablet  6  . Multiple Vitamins-Minerals (ONE-A-DAY EXTRAS ANTIOXIDANT) CAPS Take 1 capsule by mouth daily.        . Multiple Vitamins-Minerals (PRESERVISION/LUTEIN) CAPS Take 1 capsule by mouth 2 (two) times daily.        . nitroGLYCERIN (NITROSTAT) 0.4 MG SL tablet Place 1 tablet (0.4 mg total) under the tongue every 5 (five) minutes as needed.  25 tablet  3  . NORVASC 10 MG tablet TAKE 1 TABLET ONCE DAILY  30 each  6  . PLAVIX 75 MG  tablet TAKE 1 TABLET ONCE DAILY  30 each  6  . potassium chloride SA (K-DUR,KLOR-CON) 20 MEQ tablet Take 30 mEq by mouth 2 (two) times daily.       . traZODone (DESYREL) 50 MG tablet Take 50 mg by mouth daily as needed.       . venlafaxine (EFFEXOR-XR) 75 MG 24 hr capsule Take 225 mg by mouth daily.          Past Medical History  Diagnosis Date  . CAD (coronary artery disease)     NSTEMI/BMS pCFX, 01/08  . PAD (peripheral artery disease)     mild left renal artery stenosis; severe osital SMA stenosis  . CVD (cerebrovascular disease)     non obstructive  . Dyslipidemia   . HTN (hypertension)   . Anxiety and depression   . Colitis     Past Surgical History  Procedure Date  . Abdominal hysterectomy     abd?    Family History  Problem Relation Age of Onset  . Diabetes Neg Hx   . Hypertension Neg Hx   . Coronary artery disease Neg Hx     History   Social History  . Marital Status: Married    Spouse Name: N/A    Number of Children: N/A  .  Years of Education: N/A   Occupational History  . Not on file.   Social History Main Topics  . Smoking status: Never Smoker   . Smokeless tobacco: Never Used   Comment: tobacco use - no  . Alcohol Use: Not on file  . Drug Use: Not on file  . Sexually Active: Not on file   Other Topics Concern  . Not on file   Social History Narrative  . No narrative on file   [id number] positives as outlined above. The remainder of the 18  point review of systems is negative  PHYSICAL EXAM BP 125/72  Pulse 80  Ht 4\' 11"  (1.499 m)  Wt 149 lb (67.586 kg)  BMI 30.09 kg/m2  General: Well-developed, well-nourished in no distress Head: Normocephalic and atraumatic Eyes:PERRLA/EOMI intact, conjunctiva and lids normal Ears: No deformity or lesions Mouth:normal dentition, normal posterior pharynx Neck: Supple, no JVD.  No masses, thyromegaly or abnormal cervical nodes Lungs: Normal breath sounds bilaterally without wheezing.  Normal  percussion Cardiac: regular rate and rhythm with normal S1 and S2, no S3 or S4.  PMI is normal.  No pathological murmurs Abdomen: Normal bowel sounds, abdomen is soft and nontender without masses, organomegaly or hernias noted.  No hepatosplenomegaly MSK: Back normal, normal gait muscle strength and tone normal Vascular: Pulse is normal in all 4 extremities Extremities: No peripheral pitting edema Neurologic: Alert and oriented x 3 Skin: Intact without lesions or rashes Lymphatics: No significant adenopathy Psychologic: Normal affect    ECG: Not available  ASSESSMENT AND PLAN

## 2011-08-07 NOTE — Assessment & Plan Note (Addendum)
Stable no evidence of ischemia on recent stress test. Chest pain was atypical and likely musculoskeletal. Continue dual antiplatelet therapy indefinitely for now.

## 2011-09-11 ENCOUNTER — Other Ambulatory Visit: Payer: Self-pay | Admitting: Cardiology

## 2012-01-02 ENCOUNTER — Other Ambulatory Visit: Payer: Self-pay | Admitting: Cardiology

## 2012-01-19 ENCOUNTER — Other Ambulatory Visit: Payer: Self-pay | Admitting: Cardiology

## 2012-01-31 ENCOUNTER — Other Ambulatory Visit: Payer: Self-pay | Admitting: Cardiology

## 2012-02-01 ENCOUNTER — Other Ambulatory Visit: Payer: Self-pay | Admitting: Cardiology

## 2012-03-23 ENCOUNTER — Other Ambulatory Visit: Payer: Self-pay | Admitting: Cardiology

## 2012-04-13 ENCOUNTER — Other Ambulatory Visit: Payer: Self-pay | Admitting: Cardiology

## 2012-04-23 ENCOUNTER — Encounter: Payer: Self-pay | Admitting: Cardiology

## 2012-04-23 ENCOUNTER — Ambulatory Visit (INDEPENDENT_AMBULATORY_CARE_PROVIDER_SITE_OTHER): Payer: 59 | Admitting: Cardiology

## 2012-04-23 VITALS — BP 140/75 | HR 82 | Ht 59.0 in | Wt 153.0 lb

## 2012-04-23 DIAGNOSIS — E785 Hyperlipidemia, unspecified: Secondary | ICD-10-CM

## 2012-04-23 DIAGNOSIS — I2510 Atherosclerotic heart disease of native coronary artery without angina pectoris: Secondary | ICD-10-CM

## 2012-04-23 DIAGNOSIS — R0989 Other specified symptoms and signs involving the circulatory and respiratory systems: Secondary | ICD-10-CM

## 2012-04-23 DIAGNOSIS — I259 Chronic ischemic heart disease, unspecified: Secondary | ICD-10-CM

## 2012-05-12 NOTE — Assessment & Plan Note (Signed)
Hyperlipidemia with blood work to be followed by Dr. Loney Hering her primary care physician

## 2012-05-12 NOTE — Assessment & Plan Note (Signed)
No recurrent chest pain.  No ischemia workup necessary lexiscan in 2012 was negative for ischemia

## 2012-05-12 NOTE — Progress Notes (Signed)
Peyton Bottoms, MD, [facility] ABIM Board Certified in Adult Cardiovascular Medicine,Internal Medicine and Critical Care Medicine    CC:      followup patient with coronary artery disease                                                                            HPI:      The patient is a 61 year old female with a history of coronary artery disease status post bare-metal stent.  She had a lexiscan 2012which was negative for ischemia.  She has had no recurrent angina.  She has been doing well. She has a history  of hypertension which has been well-controlled.she denies any orthopnea PND palpitations or syncope.  The patient is questioning whether she can take an amphetamine for weight loss and I discouraged her from doing so. She reports no other complaints.  PMH: reviewed and listed in Problem List in Electronic Records (and see below) Past Medical History  Diagnosis Date  . CAD (coronary artery disease)     NSTEMI/BMS pCFX, 01/08  . PAD (peripheral artery disease)     mild left renal artery stenosis; severe osital SMA stenosis  . CVD (cerebrovascular disease)     non obstructive  . Dyslipidemia   . HTN (hypertension)   . Anxiety and depression   . Colitis    Past Surgical History  Procedure Date  . Abdominal hysterectomy     abd?    Allergies/SH/FHX : available in Electronic Records for review  Allergies  Allergen Reactions  . Penicillins     REACTION: ?rash   History   Social History  . Marital Status: Married    Spouse Name: N/A    Number of Children: N/A  . Years of Education: N/A   Occupational History  . Not on file.   Social History Main Topics  . Smoking status: Never Smoker   . Smokeless tobacco: Never Used   Comment: tobacco use - no  . Alcohol Use: Not on file  . Drug Use: Not on file  . Sexually Active: Not on file   Other Topics Concern  . Not on file   Social History Narrative  . No narrative on file   Family History  Problem Relation Age of  Onset  . Diabetes Neg Hx   . Hypertension Neg Hx   . Coronary artery disease Neg Hx     Medications: Current Outpatient Prescriptions  Medication Sig Dispense Refill  . ALPRAZolam (XANAX) 0.25 MG tablet Take 0.25 mg by mouth 2 (two) times daily as needed.        Marland Kitchen amLODipine (NORVASC) 10 MG tablet TAKE 1 TABLET ONCE DAILY  30 tablet  6  . aspirin 81 MG EC tablet Take 81 mg by mouth daily.        . carvedilol (COREG) 12.5 MG tablet TAKE 1 TABLET TWICE DAILY.  60 tablet  6  . fexofenadine (ALLEGRA) 180 MG tablet Take 180 mg by mouth as needed.        . hydrochlorothiazide (HYDRODIURIL) 25 MG tablet TAKE 1 TABLET ONCE DAILY  30 tablet  6  . isosorbide mononitrate (IMDUR) 30 MG 24 hr tablet TAKE 1 TABLET  ONCE DAILY  30 tablet  6  . Multiple Vitamins-Minerals (ONE-A-DAY EXTRAS ANTIOXIDANT) CAPS Take 1 capsule by mouth daily.        . Multiple Vitamins-Minerals (PRESERVISION/LUTEIN) CAPS Take 1 capsule by mouth 2 (two) times daily.        . nitroGLYCERIN (NITROSTAT) 0.4 MG SL tablet Place 1 tablet (0.4 mg total) under the tongue every 5 (five) minutes as needed.  25 tablet  3  . potassium chloride SA (K-DUR,KLOR-CON) 20 MEQ tablet Take 30 mEq by mouth 2 (two) times daily.       Marland Kitchen RASPBERRY PO Take 1 tablet by mouth daily.      . traZODone (DESYREL) 50 MG tablet Take 50 mg by mouth daily as needed.       . venlafaxine (EFFEXOR-XR) 75 MG 24 hr capsule Take 225 mg by mouth daily.        Marland Kitchen VYTORIN 10-20 MG per tablet TAKE 1 TABLET ONCE DAILY  30 each  6    ROS: No nausea or vomiting. No fever or chills.No melena or hematochezia.No bleeding.No claudication  Physical Exam: BP 140/75  Pulse 82  Ht 4\' 11"  (1.499 m)  Wt 153 lb (69.4 kg)  BMI 30.90 kg/m2 General:well-nourished white female in no distress Neck:normal carotid upstroke no carotid bruits.  No thyromegaly.  Normal carotid upstroke Lungs:clear breath sounds bilaterally no wheezing Cardiac:regular rate and rhythm normal S1-S2 no murmur  rubs or gallops Vascular:no edema.  Normal distal pulses Skin:warm and dry Physcologic:normal affect  12lead ZOX:WRUEAV sinus rhythm and no acute ischemic changes of prior infarct pattern Limited bedside ECHO:N/A No images are attached to the encounter.   I reviewed and summarized the old records. I reviewed ECG and prior blood work.  Assessment and Plan  Carotid bruit Stable.  Chest pain No recurrent chest pain.  No ischemia workup necessary lexiscan in 2012 was negative for ischemia  CORONARY ARTERY DISEASE, S/P PTCA Status post bare-metal stent in 2000.  While this can be discontinued continue aspirin.  DYSLIPIDEMIA Hyperlipidemia with blood work to be followed by Dr. Loney Hering her primary care physician    Patient Active Problem List  Diagnosis  . DYSLIPIDEMIA  . ANXIETY  . DYSTHYMIC DISORDER  . ALCOHOLISM  . ESSENTIAL HYPERTENSION, BENIGN  . CORONARY ARTERY DISEASE, S/P PTCA  . RENAL ARTERY STENOSIS  . CHRONIC VASCULAR INSUFFICIENCY OF INTESTINE  . INSOMNIA  . LEG EDEMA  . Chest pain  . Carotid bruit

## 2012-05-12 NOTE — Assessment & Plan Note (Signed)
Status post bare-metal stent in 2000.  While this can be discontinued continue aspirin.

## 2012-05-12 NOTE — Assessment & Plan Note (Signed)
Stable

## 2012-05-15 ENCOUNTER — Telehealth: Payer: Self-pay | Admitting: *Deleted

## 2012-05-15 DIAGNOSIS — R0989 Other specified symptoms and signs involving the circulatory and respiratory systems: Secondary | ICD-10-CM

## 2012-05-15 NOTE — Telephone Encounter (Signed)
The Order that is in the system for cartoid dopplers will expire before September Will need to write new order so that test can be scheduled.

## 2012-05-20 NOTE — Telephone Encounter (Signed)
New order done

## 2012-07-17 ENCOUNTER — Encounter (INDEPENDENT_AMBULATORY_CARE_PROVIDER_SITE_OTHER): Payer: 59

## 2012-07-17 DIAGNOSIS — I6529 Occlusion and stenosis of unspecified carotid artery: Secondary | ICD-10-CM

## 2012-07-17 DIAGNOSIS — R0989 Other specified symptoms and signs involving the circulatory and respiratory systems: Secondary | ICD-10-CM

## 2012-07-25 ENCOUNTER — Encounter: Payer: Self-pay | Admitting: *Deleted

## 2012-09-07 ENCOUNTER — Other Ambulatory Visit: Payer: Self-pay | Admitting: Cardiology

## 2012-10-28 ENCOUNTER — Other Ambulatory Visit: Payer: Self-pay | Admitting: Cardiology

## 2012-11-19 ENCOUNTER — Other Ambulatory Visit: Payer: Self-pay | Admitting: *Deleted

## 2012-11-19 MED ORDER — CARVEDILOL 12.5 MG PO TABS: 12.5000 mg | ORAL_TABLET | Freq: Two times a day (BID) | ORAL | Status: DC

## 2013-02-28 ENCOUNTER — Other Ambulatory Visit: Payer: Self-pay | Admitting: Physician Assistant

## 2013-04-21 ENCOUNTER — Encounter: Payer: Self-pay | Admitting: Physician Assistant

## 2013-04-21 ENCOUNTER — Ambulatory Visit (INDEPENDENT_AMBULATORY_CARE_PROVIDER_SITE_OTHER): Payer: 59 | Admitting: Physician Assistant

## 2013-04-21 VITALS — BP 133/67 | HR 77 | Ht 59.0 in | Wt 155.0 lb

## 2013-04-21 DIAGNOSIS — R0989 Other specified symptoms and signs involving the circulatory and respiratory systems: Secondary | ICD-10-CM

## 2013-04-21 DIAGNOSIS — I679 Cerebrovascular disease, unspecified: Secondary | ICD-10-CM

## 2013-04-21 DIAGNOSIS — I10 Essential (primary) hypertension: Secondary | ICD-10-CM

## 2013-04-21 DIAGNOSIS — E785 Hyperlipidemia, unspecified: Secondary | ICD-10-CM

## 2013-04-21 DIAGNOSIS — I2510 Atherosclerotic heart disease of native coronary artery without angina pectoris: Secondary | ICD-10-CM

## 2013-04-21 MED ORDER — NITROGLYCERIN 0.4 MG SL SUBL: 0.4000 mg | SUBLINGUAL_TABLET | SUBLINGUAL | Status: AC | PRN

## 2013-04-21 NOTE — Assessment & Plan Note (Signed)
Quiescent on current medication regimen. Renew prescription for NTG. Patient had a NL Lexiscan Cardiolite; EF 62%, 06/2011. We'll plan on return visit in one year.

## 2013-04-21 NOTE — Progress Notes (Signed)
Primary Cardiologist: Rollene Rotunda, MD (new)   HPI: 62 year old female, with known CAD, formerly followed by Dr. [name], now returns for annual followup. When last seen in June 2013, Plavix was discontinued.  She returns today reporting no interim development of exertional CP or DOE.    12-lead EKG today, reviewed by me, indicates NSR 71 bpm; LAD; rSr'  Allergies  Allergen Reactions  . Penicillins     REACTION: ?rash    Current Outpatient Prescriptions  Medication Sig Dispense Refill  . ALPRAZolam (XANAX) 0.25 MG tablet Take 0.25 mg by mouth 2 (two) times daily as needed.        Marland Kitchen amLODipine (NORVASC) 10 MG tablet TAKE 1 TABLET ONCE DAILY  30 tablet  6  . aspirin 81 MG EC tablet Take 81 mg by mouth daily.        . carvedilol (COREG) 12.5 MG tablet Take 1 tablet (12.5 mg total) by mouth 2 (two) times daily.  60 tablet  6  . fexofenadine (ALLEGRA) 180 MG tablet Take 180 mg by mouth as needed.        . hydrochlorothiazide (HYDRODIURIL) 25 MG tablet TAKE 1 TABLET ONCE DAILY  30 tablet  6  . isosorbide mononitrate (IMDUR) 30 MG 24 hr tablet TAKE 1 TABLET ONCE DAILY  30 tablet  8  . Multiple Vitamins-Minerals (ONE-A-DAY EXTRAS ANTIOXIDANT) CAPS Take 1 capsule by mouth daily.        . Multiple Vitamins-Minerals (PRESERVISION/LUTEIN) CAPS Take 1 capsule by mouth 2 (two) times daily.        . nitroGLYCERIN (NITROSTAT) 0.4 MG SL tablet Place 1 tablet (0.4 mg total) under the tongue every 5 (five) minutes as needed.  25 tablet  3  . potassium chloride SA (K-DUR,KLOR-CON) 20 MEQ tablet Take 30 mEq by mouth 2 (two) times daily.       Marland Kitchen RASPBERRY PO Take 1 tablet by mouth daily.      . traZODone (DESYREL) 50 MG tablet Take 50 mg by mouth daily as needed.       . venlafaxine (EFFEXOR-XR) 75 MG 24 hr capsule Take 225 mg by mouth daily.        Marland Kitchen VYTORIN 10-20 MG per tablet TAKE 1 TABLET ONCE DAILY  30 tablet  3   No current facility-administered medications for this visit.    Past Medical  History  Diagnosis Date  . CAD (coronary artery disease)     NSTEMI/BMS pCFX, 01/08  . PAD (peripheral artery disease)     mild left renal artery stenosis; severe osital SMA stenosis  . CVD (cerebrovascular disease)     non obstructive  . Dyslipidemia   . HTN (hypertension)   . Anxiety and depression   . Colitis     Past Surgical History  Procedure Laterality Date  . Abdominal hysterectomy      abd?    History   Social History  . Marital Status: Married    Spouse Name: N/A    Number of Children: N/A  . Years of Education: N/A   Occupational History  . Not on file.   Social History Main Topics  . Smoking status: Never Smoker   . Smokeless tobacco: Never Used     Comment: tobacco use - no  . Alcohol Use: Not on file  . Drug Use: Not on file  . Sexually Active: Not on file   Other Topics Concern  . Not on file   Social History Narrative  . No  narrative on file   Social History Narrative  . No narrative on file    Problem Relation Age of Onset  . Diabetes Neg Hx   . Hypertension Neg Hx   . Coronary artery disease Neg Hx     ROS: no nausea, vomiting; no fever, chills; no melena, hematochezia; no claudication  PHYSICAL EXAM: BP 133/67  Pulse 77  Ht 4\' 11"  (1.499 m)  Wt 155 lb (70.308 kg)  BMI 31.29 kg/m2  SpO2 96% GENERAL: 62 year old female, moderately overweight; NAD HEENT: NCAT, PERRLA, EOMI; sclera clear; no xanthelasma NECK: Soft, bilateral carotid bruits; no JVD; no TM LUNGS: CTA bilaterally CARDIAC: RRR (S1, S2); no significant murmurs; no rubs or gallops ABDOMEN: soft, protuberant EXTREMETIES: no significant peripheral edema SKIN: warm/dry; no obvious rash/lesions MUSCULOSKELETAL: no joint deformity NEURO: no focal deficit; NL affect   EKG: reviewed and available in Electronic Records   ASSESSMENT & PLAN:  CAD (coronary artery disease) Quiescent on current medication regimen. Renew prescription for NTG. Patient had a NL Lexiscan  Cardiolite; EF 62%, 06/2011. We'll plan on return visit in one year.  Carotid bruit History of nonobstructive ICA disease, followed by primary M.D.  Dyslipidemia On statin therapy, followed by primary M.D. Recommend high-intensity statin therapy with LDL reduction of 50% or more from baseline, if feasible.    [name], PAC

## 2013-04-21 NOTE — Patient Instructions (Signed)
Continue all current medications. Nitroglycerin as needed for severe chest pain only - refill sent to pharm  Your physician wants you to follow up in:  1 year.  You will receive a reminder letter in the mail one-two months in advance.  If you don't receive a letter, please call our office to schedule the follow up appointment

## 2013-04-21 NOTE — Assessment & Plan Note (Signed)
History of nonobstructive ICA disease, followed by primary M.D.

## 2013-04-21 NOTE — Assessment & Plan Note (Signed)
On statin therapy, followed by primary M.D. Recommend high-intensity statin therapy with LDL reduction of 50% or more from baseline, if feasible.

## 2013-04-30 ENCOUNTER — Other Ambulatory Visit: Payer: Self-pay | Admitting: *Deleted

## 2013-04-30 DIAGNOSIS — R0989 Other specified symptoms and signs involving the circulatory and respiratory systems: Secondary | ICD-10-CM

## 2013-06-06 ENCOUNTER — Other Ambulatory Visit: Payer: Self-pay | Admitting: Physician Assistant

## 2013-06-25 ENCOUNTER — Other Ambulatory Visit: Payer: Self-pay | Admitting: Physician Assistant

## 2013-06-30 ENCOUNTER — Other Ambulatory Visit: Payer: Self-pay | Admitting: Physician Assistant

## 2013-07-16 ENCOUNTER — Encounter (INDEPENDENT_AMBULATORY_CARE_PROVIDER_SITE_OTHER): Payer: 59

## 2013-07-16 DIAGNOSIS — R0989 Other specified symptoms and signs involving the circulatory and respiratory systems: Secondary | ICD-10-CM

## 2013-07-16 DIAGNOSIS — I6529 Occlusion and stenosis of unspecified carotid artery: Secondary | ICD-10-CM

## 2013-07-29 ENCOUNTER — Telehealth: Payer: Self-pay | Admitting: Physician Assistant

## 2013-07-29 NOTE — Telephone Encounter (Signed)
New problem  [name], [name] said she was returning you call,re:test result.

## 2013-07-29 NOTE — Telephone Encounter (Signed)
Pt is followed [facility]. Will forward to [facility].

## 2013-07-30 NOTE — Telephone Encounter (Signed)
Patient notified of stable carotid dopplers via voice mail.

## 2013-11-03 ENCOUNTER — Other Ambulatory Visit: Payer: Self-pay | Admitting: Physician Assistant

## 2013-12-03 ENCOUNTER — Telehealth: Payer: Self-pay | Admitting: *Deleted

## 2013-12-03 DIAGNOSIS — E785 Hyperlipidemia, unspecified: Secondary | ICD-10-CM

## 2013-12-03 DIAGNOSIS — Z79899 Other long term (current) drug therapy: Secondary | ICD-10-CM

## 2013-12-03 MED ORDER — SIMVASTATIN 20 MG PO TABS: 20.0000 mg | ORAL_TABLET | Freq: Every day | ORAL | Status: DC

## 2013-12-03 NOTE — Telephone Encounter (Signed)
Just tell her to take the 20 mg Zocor alone and check a lipid and liver in 8 weeks.

## 2013-12-03 NOTE — Telephone Encounter (Signed)
Left message on home number informing patient of the below information and also for patient to call office for confirmation.

## 2013-12-03 NOTE — Telephone Encounter (Signed)
Patient called to inform our office that her insurance copay for vytorin has gone up to $250 and she can't afford this. Please advise if this patient can just take simvastatin alone and if so, please advise of dose.

## 2013-12-03 NOTE — Telephone Encounter (Signed)
Patient confirmed and wants orders mailed to her address and she will have these done at [facility].

## 2014-01-01 ENCOUNTER — Other Ambulatory Visit: Payer: Self-pay | Admitting: Physician Assistant

## 2014-01-29 LAB — HEPATIC FUNCTION PANEL
ALK PHOS: 63 U/L (ref 39–117)
ALT: 53 U/L — AB (ref 0–35)
AST: 40 U/L — AB (ref 0–37)
Albumin: 4.7 g/dL (ref 3.5–5.2)
BILIRUBIN DIRECT: 0.1 mg/dL (ref 0.0–0.3)
BILIRUBIN INDIRECT: 0.6 mg/dL (ref 0.0–0.9)
Total Bilirubin: 0.7 mg/dL (ref 0.3–1.2)
Total Protein: 7.5 g/dL (ref 6.0–8.3)

## 2014-01-29 LAB — LIPID PANEL
Cholesterol: 170 mg/dL (ref 0–200)
HDL: 48 mg/dL (ref >39)
LDL CALC: 81 mg/dL (ref 0–99)
TRIGLYCERIDES: 203 mg/dL — AB (ref <150)
Total CHOL/HDL Ratio: 3.5 Ratio
VLDL: 41 mg/dL — AB (ref 0–40)

## 2014-02-03 ENCOUNTER — Telehealth: Payer: Self-pay | Admitting: *Deleted

## 2014-02-03 NOTE — Telephone Encounter (Signed)
Patient informed and copy sent to PCP. 

## 2014-02-03 NOTE — Telephone Encounter (Signed)
Message copied by Merlene Laughter on Tue Feb 03, 2014  2:27 PM ------      Message from: FLEMING, PAMELA J      Created: Mon Feb 02, 2014  3:44 PM                   ----- Message -----         From: Minus Breeding, MD         Sent: 02/02/2014   9:03 AM           To: Ellwood Dense, RN            Labs OK.  Call Ms. Kayleen Memos with the results and send results to Celedonio Savage, MD       ------

## 2014-04-21 ENCOUNTER — Ambulatory Visit (INDEPENDENT_AMBULATORY_CARE_PROVIDER_SITE_OTHER): Payer: Medicare Other | Admitting: Cardiology

## 2014-04-21 VITALS — BP 127/78 | HR 80 | Ht 59.0 in | Wt 150.0 lb

## 2014-04-21 DIAGNOSIS — R011 Cardiac murmur, unspecified: Secondary | ICD-10-CM

## 2014-04-21 DIAGNOSIS — I2510 Atherosclerotic heart disease of native coronary artery without angina pectoris: Secondary | ICD-10-CM

## 2014-04-21 DIAGNOSIS — E785 Hyperlipidemia, unspecified: Secondary | ICD-10-CM

## 2014-04-21 DIAGNOSIS — I10 Essential (primary) hypertension: Secondary | ICD-10-CM

## 2014-04-21 DIAGNOSIS — R0989 Other specified symptoms and signs involving the circulatory and respiratory systems: Secondary | ICD-10-CM

## 2014-04-21 NOTE — Patient Instructions (Signed)
Your physician has requested that you have an echocardiogram. Echocardiography is a painless test that uses sound waves to create images of your heart. It provides your doctor with information about the size and shape of your heart and how well your heart's chambers and valves are working. This procedure takes approximately one hour. There are no restrictions for this procedure. Office will contact with results via phone or letter.   Continue all current medications. Your physician wants you to follow up in:  1 year.  You will receive a reminder letter in the mail one-two months in advance.  If you don't receive a letter, please call our office to schedule the follow up appointment    

## 2014-04-21 NOTE — Progress Notes (Signed)
Clinical Summary Ms. Oddo is a 63 y.o.female last seen yb PA Serpe, this is our first visit together. She is seen for the following medical problems.  1. CAD - hx of NSTEMI in 2008, received a BMS to the LCX.  - LVEF 75% by LV gram in 2008, do not see echo in system - denies any chest pain. No SOB or DOE. Goes to [facility] regularly on stationary bike without troubles - compliant with meds  2. PAD - mild left renal artery stenosis, bp controlled with no reported history of renal disease. We do not have recent labs in our system.  - severe ostial SMA stenosis, denies any stomach pain.  - carotid bruit mild to moderate by most recent US 07/2013  3. HTN - does not check at home - compliant with meds  4. Hyperlipidemia - last panel showed 01/2014 TC 170 TG 203 HDL 48 LDL 81  Past Medical History  Diagnosis Date  . CAD (coronary artery disease)     NSTEMI/BMS pCFX, 01/08  . PAD (peripheral artery disease)     mild left renal artery stenosis; severe osital SMA stenosis  . CVD (cerebrovascular disease)     non obstructive  . Dyslipidemia   . HTN (hypertension)   . Anxiety and depression   . Colitis      Allergies  Allergen Reactions  . Penicillins     REACTION: ?rash     Current Outpatient Prescriptions  Medication Sig Dispense Refill  . ALPRAZolam (XANAX) 0.25 MG tablet Take 0.25 mg by mouth 2 (two) times daily as needed.        Marland Kitchen amLODipine (NORVASC) 10 MG tablet TAKE 1 TABLET ONCE DAILY  30 tablet  6  . aspirin 81 MG EC tablet Take 81 mg by mouth daily.        . carvedilol (COREG) 12.5 MG tablet TAKE ONE TABLET BY MOUTH TWICE DAILY  60 tablet  6  . fexofenadine (ALLEGRA) 180 MG tablet Take 180 mg by mouth as needed.        . hydrochlorothiazide (HYDRODIURIL) 25 MG tablet TAKE 1 TABLET ONCE DAILY  30 tablet  6  . isosorbide mononitrate (IMDUR) 30 MG 24 hr tablet TAKE ONE TABLET ONCE DAILY  30 tablet  6  . isosorbide mononitrate (IMDUR) 30 MG 24 hr tablet TAKE ONE  TABLET BY MOUTH ONCE DAILY  30 tablet  6  . Multiple Vitamins-Minerals (ONE-A-DAY EXTRAS ANTIOXIDANT) CAPS Take 1 capsule by mouth daily.        . Multiple Vitamins-Minerals (PRESERVISION/LUTEIN) CAPS Take 1 capsule by mouth 2 (two) times daily.        . nitroGLYCERIN (NITROSTAT) 0.4 MG SL tablet Place 1 tablet (0.4 mg total) under the tongue every 5 (five) minutes as needed.  25 tablet  3  . potassium chloride SA (K-DUR,KLOR-CON) 20 MEQ tablet Take 30 mEq by mouth 2 (two) times daily.       Marland Kitchen RASPBERRY PO Take 1 tablet by mouth daily.      . simvastatin (ZOCOR) 20 MG tablet Take 1 tablet (20 mg total) by mouth at bedtime.  90 tablet  3  . traZODone (DESYREL) 50 MG tablet Take 50 mg by mouth daily as needed.       . venlafaxine (EFFEXOR-XR) 75 MG 24 hr capsule Take 225 mg by mouth daily.         No current facility-administered medications for this visit.     Past  Surgical History  Procedure Laterality Date  . Abdominal hysterectomy      abd?     Allergies  Allergen Reactions  . Penicillins     REACTION: ?rash      Family History  Problem Relation Age of Onset  . Diabetes Neg Hx   . Hypertension Neg Hx   . Coronary artery disease Neg Hx      Social History Ms. Aslinger reports that she has never smoked. She has never used smokeless tobacco. Ms. Roller has no alcohol history on file.   Review of Systems CONSTITUTIONAL: No weight loss, fever, chills, weakness or fatigue.  HEENT: Eyes: No visual loss, blurred vision, double vision or yellow sclerae.No hearing loss, sneezing, congestion, runny nose or sore throat.  SKIN: No rash or itching.  CARDIOVASCULAR:per HPI  RESPIRATORY: No shortness of breath, cough or sputum.  GASTROINTESTINAL: No anorexia, nausea, vomiting or diarrhea. No abdominal pain or blood.  GENITOURINARY: No burning on urination, no polyuria NEUROLOGICAL: No headache, dizziness, syncope, paralysis, ataxia, numbness or tingling in the extremities. No  change in bowel or bladder control.  MUSCULOSKELETAL: No muscle, back pain, joint pain or stiffness.  LYMPHATICS: No enlarged nodes. No history of splenectomy.  PSYCHIATRIC: No history of depression or anxiety.  ENDOCRINOLOGIC: No reports of sweating, cold or heat intolerance. No polyuria or polydipsia.  Marland Kitchen   Physical Examination p 80 bp 127/78 Wt 150 lbs BMI 30 Gen: resting comfortably, no acute distress HEENT: no scleral icterus, pupils equal round and reactive, no palptable cervical adenopathy,  CV: RRR, 2/6 systolic murmur RUSB, + right carotid bruit Resp: Clear to auscultation bilaterally GI: abdomen is soft, non-tender, non-distended, normal bowel sounds, no hepatosplenomegaly MSK: extremities are warm, no edema.  Skin: warm, no rash Neuro:  no focal deficits Psych: appropriate affect   Diagnostic Studies 11/2006 Cath FINDINGS:  1. LV: 133/13/35.  2. No aortic stenosis on pullback.  3. Left ventriculography: This was deferred due to markedly elevated  left ventricular end diastolic pressure.  4. Left main: There is no left main.  5. LAD: A fairly large vessel which wraps the apex of the heart. It  gives rise to a moderate sized diagonal. The proximal LAD is  heavily calcified. There is diffuse 30% stenosis. There is then a  focal 50% stenosis just after the origin of the diagonal.  6. Circumflex: Fairly large codominant vessel. There was a 99%  stenosis proximally and a 60% stenosis just after the marginal  which were stented using two overlapping bare metal stents. Flow  improved from TIMI II to TIMI III.  7. RCA: Relatively small, codominant vessel. There is a 30% stenosis  in the mid vessel just after the origin of acute marginal. The  acute marginal has a 90% ostial stenosis. It is a fairly small  vessel.  RECOMMENDATIONS: Successful percutaneous revascularization of the  proximal circumflex. A small non flow limiting proximal edge dissection  required an additional  overlapping stent. There was an excellent final  result. Her left ventricular end diastolic pressure is markedly  elevated. I, therefore, did not perform ventriculography. We will plan  on continuation of beta blocker in addition to ACE inhibitor. We will  continue aspirin indefinitely. Plavix should be continued for a minimum  of 30 days and preferably nine months given the acute presentation. She  will need echocardiogram to assess left ventricular systolic function.    12/2006 Cath HEMODYNAMIC RESULTS: Aorta 143/74 mmHg, left ventricle 140/28 mmHg.  ANGIOGRAPHIC  FINDINGS:  1. There is a very short left main, essentially nearby separate ostia  of the left anterior descending and circumflex coronary arteries.  2. The left anterior descending is a medium to small caliber vessel  with distal tapering and 30% midvessel stenosis. There is  essentially one medium-sized branching diagonal. No flow-limiting  stenoses are noted.  3. The circumflex coronary artery is a medium to large caliber vessel  that is codominant with a small right coronary artery. Stent site  is visualized from the proximal portion of the vessel and was  widely patent. Otherwise there are minor luminal irregularities.  There is a ramus intermedius and a large multi-branched obtuse  marginal in the midvessel segment.  4. The right coronary artery is small and codominant. There is an  acute marginal visualized with approximately 80% ostial stenosis  which is not a new finding. Otherwise minor luminal irregularities  are noted.  5. Left ventriculography is performed in the RAO projection and  reveals an ejection fraction approximately 75% with no focal  anterior or inferior wall motion abnormality and trace mitral  regurgitation in the setting of ventricular ectopy.  DIAGNOSIS:  1. Mild coronary atherosclerosis as discussed above with widely patent  stent site within the proximal circumflex.  2. Left ventricular  ejection fraction approximately 75% with an  increased left ventricular end-diastolic pressure of 28 mmHg, trace  mitral regurgitation in the setting of ventricular ectopy, and no  significant pullback gradient.  DISCUSSION: I reviewed the results with the patient and with Dr. Dannielle Burn  by phone. At this point I would anticipate continued medical therapy.  Discharge will be planned for later today with follow-up in the [facility]  office with Dr. Dannielle Burn.    07/2013 Carotid US RICA 3-53%, LICA 61-44%, patent antegrade vertebrals  Assessment and Plan   1. CAD - no current symptoms - continue risk factor modification and secondary prevention  2. PAD - no current symptoms, continue medical therapy  3. HTN - at goal, continue current meds  4. Hyperlipidemia - slightly above goal on last check, she has been continued on current statin. Follow panel for now, possible further titration in the future.   5. Heart murmur - check echo  F/u 1 year  Arnoldo Lenis, M.D., F.A.C.C.

## 2014-04-22 ENCOUNTER — Other Ambulatory Visit: Payer: Self-pay

## 2014-04-22 ENCOUNTER — Other Ambulatory Visit (INDEPENDENT_AMBULATORY_CARE_PROVIDER_SITE_OTHER): Payer: Medicare Other

## 2014-04-22 DIAGNOSIS — R011 Cardiac murmur, unspecified: Secondary | ICD-10-CM

## 2014-04-22 DIAGNOSIS — I059 Rheumatic mitral valve disease, unspecified: Secondary | ICD-10-CM

## 2014-04-27 ENCOUNTER — Encounter: Payer: Self-pay | Admitting: *Deleted

## 2014-06-02 ENCOUNTER — Other Ambulatory Visit: Payer: Self-pay | Admitting: Cardiology

## 2014-11-23 ENCOUNTER — Telehealth: Payer: Self-pay | Admitting: *Deleted

## 2014-11-23 DIAGNOSIS — R0989 Other specified symptoms and signs involving the circulatory and respiratory systems: Secondary | ICD-10-CM

## 2014-11-23 NOTE — Telephone Encounter (Signed)
Orders in for carotid US. Will forward to be scheduled

## 2014-11-23 NOTE — Telephone Encounter (Signed)
-----   Message from Arnoldo Lenis, MD sent at 11/20/2014 11:27 AM EST ----- Regarding: RE: 2 YR CARTOID  DUE  Please order carotid US for patient   Zandra Abts MD ----- Message -----    From: Laurine Blazer, LPN    Sent: 04/20/880   5:09 PM      To: Arnoldo Lenis, MD Subject: RE: 2 YR CARTOID  DUE                          See below.  Last carotid doppler was 07/26/13 and Dr. Percival Spanish put in his result note to follow up as suggested.  Actually report says repeat 1 year.  She is due for recall with you 04/2015.  Please advise.  Thanks,  Edd Fabian    ----- Message -----    From: Chanda Busing    Sent: 11/18/2014   4:52 PM      To: Laurine Blazer, LPN Subject: 2 YR CARTOID  DUE                              According to report she is due cartoids 2 yr fu Can you check?

## 2014-11-24 ENCOUNTER — Other Ambulatory Visit: Payer: Self-pay | Admitting: *Deleted

## 2014-11-24 DIAGNOSIS — R0989 Other specified symptoms and signs involving the circulatory and respiratory systems: Secondary | ICD-10-CM

## 2014-12-02 ENCOUNTER — Encounter (INDEPENDENT_AMBULATORY_CARE_PROVIDER_SITE_OTHER): Payer: Medicare Other

## 2014-12-02 DIAGNOSIS — I6523 Occlusion and stenosis of bilateral carotid arteries: Secondary | ICD-10-CM

## 2014-12-02 DIAGNOSIS — R0989 Other specified symptoms and signs involving the circulatory and respiratory systems: Secondary | ICD-10-CM

## 2014-12-08 ENCOUNTER — Telehealth: Payer: Self-pay | Admitting: *Deleted

## 2014-12-08 NOTE — Telephone Encounter (Signed)
Pt made aware, forwarded to Dr. Wenda Overland

## 2014-12-08 NOTE — Telephone Encounter (Signed)
-----   Message from Arnoldo Lenis, MD sent at 12/08/2014 10:34 AM EST ----- Carotid US shows mild to moderate blockages in the arteries of the neck. We will continue to monitor at this time, nothing else to do at this time  Zandra Abts MD

## 2015-05-04 ENCOUNTER — Encounter: Payer: Self-pay | Admitting: *Deleted

## 2015-05-04 ENCOUNTER — Encounter: Payer: Self-pay | Admitting: Cardiology

## 2015-05-04 ENCOUNTER — Ambulatory Visit (INDEPENDENT_AMBULATORY_CARE_PROVIDER_SITE_OTHER): Payer: Medicare Other | Admitting: Cardiology

## 2015-05-04 VITALS — BP 127/71 | HR 71 | Ht 59.0 in | Wt 146.0 lb

## 2015-05-04 DIAGNOSIS — I2510 Atherosclerotic heart disease of native coronary artery without angina pectoris: Secondary | ICD-10-CM

## 2015-05-04 DIAGNOSIS — E785 Hyperlipidemia, unspecified: Secondary | ICD-10-CM

## 2015-05-04 DIAGNOSIS — I10 Essential (primary) hypertension: Secondary | ICD-10-CM | POA: Diagnosis not present

## 2015-05-04 DIAGNOSIS — I739 Peripheral vascular disease, unspecified: Secondary | ICD-10-CM

## 2015-05-04 MED ORDER — ATORVASTATIN CALCIUM 80 MG PO TABS: 80.0000 mg | ORAL_TABLET | Freq: Every day | ORAL | Status: DC

## 2015-05-04 NOTE — Patient Instructions (Signed)
Your physician wants you to follow-up in: [place] DR. BRANCH You will receive a reminder letter in the mail two months in advance. If you don't receive a letter, please call our office to schedule the follow-up appointment.  Your physician has recommended you make the following change in your medication:   STOP SIMVASTATIN   START ATORVASTATIN 80 MG DAILY   WE WILL REQUEST LABS FROM DR. BLUTH  Thank you for choosing [facility]!!

## 2015-05-04 NOTE — Progress Notes (Signed)
Clinical Summary Ms. Mello is a 64 y.o.female seen today for follow up of the following medical problems  1. CAD - hx of NSTEMI in 2008, received a BMS to the LCX.  - LVEF 75% by LV gram in 2008, do not see echo in system  - denies any chest pain, no SOB or DOE - compliant with meds  2. PAD - mild left renal artery stenosis, bp controlled with no reported history of renal disease. m.  - severe ostial SMA stenosis, denies any stomach pain.  - mild to moderate bilateral carotid stenosis by most recent US Jan 2016. Denies any neurological symptoms.  3. HTN - does not check at home - compliant with meds  4. Hyperlipidemia - last panel showed 01/2014 TC 170 TG 203 HDL 48 LDL 81 - compliant with statin  Past Medical History  Diagnosis Date  . CAD (coronary artery disease)     NSTEMI/BMS pCFX, 01/08  . PAD (peripheral artery disease)     mild left renal artery stenosis; severe osital SMA stenosis  . CVD (cerebrovascular disease)     non obstructive  . Dyslipidemia   . HTN (hypertension)   . Anxiety and depression   . Colitis      Allergies  Allergen Reactions  . Penicillins     REACTION: ?rash     Current Outpatient Prescriptions  Medication Sig Dispense Refill  . ALPRAZolam (XANAX) 0.25 MG tablet Take 0.25 mg by mouth 2 (two) times daily as needed.      Marland Kitchen amLODipine (NORVASC) 10 MG tablet TAKE 1 TABLET ONCE DAILY 30 tablet 6  . aspirin 81 MG EC tablet Take 81 mg by mouth daily.      . carvedilol (COREG) 12.5 MG tablet TAKE ONE TABLET BY MOUTH TWICE DAILY 60 tablet 11  . Cholecalciferol (VITAMIN D3) 2000 UNITS capsule Take 2,000 Units by mouth daily.    . fluticasone (FLONASE) 50 MCG/ACT nasal spray Place 1 spray into both nostrils daily.    . hydrochlorothiazide (HYDRODIURIL) 25 MG tablet TAKE 1 TABLET ONCE DAILY 30 tablet 6  . isosorbide mononitrate (IMDUR) 30 MG 24 hr tablet TAKE ONE TABLET ONCE DAILY 30 tablet 6  . Multiple Vitamins-Minerals (ONE-A-DAY  EXTRAS ANTIOXIDANT) CAPS Take 1 capsule by mouth daily.      . Multiple Vitamins-Minerals (PRESERVISION/LUTEIN) CAPS Take 1 capsule by mouth 2 (two) times daily.      . nitroGLYCERIN (NITROSTAT) 0.4 MG SL tablet Place 1 tablet (0.4 mg total) under the tongue every 5 (five) minutes as needed. 25 tablet 3  . potassium chloride SA (K-DUR,KLOR-CON) 20 MEQ tablet Take 30 mEq by mouth 2 (two) times daily.     . simvastatin (ZOCOR) 20 MG tablet Take 1 tablet (20 mg total) by mouth at bedtime. 90 tablet 3  . venlafaxine (EFFEXOR-XR) 75 MG 24 hr capsule Take 225 mg by mouth daily.       No current facility-administered medications for this visit.     Past Surgical History  Procedure Laterality Date  . Abdominal hysterectomy      abd?     Allergies  Allergen Reactions  . Penicillins     REACTION: ?rash      Family History  Problem Relation Age of Onset  . Diabetes Neg Hx   . Hypertension Neg Hx   . Coronary artery disease Neg Hx      Social History Ms. Stricker reports that she has never smoked. She has  never used smokeless tobacco. Ms. Dyal has no alcohol history on file.   Review of Systems CONSTITUTIONAL: No weight loss, fever, chills, weakness or fatigue.  HEENT: Eyes: No visual loss, blurred vision, double vision or yellow sclerae.No hearing loss, sneezing, congestion, runny nose or sore throat.  SKIN: No rash or itching.  CARDIOVASCULAR: per HPI RESPIRATORY: No shortness of breath, cough or sputum.  GASTROINTESTINAL: No anorexia, nausea, vomiting or diarrhea. No abdominal pain or blood.  GENITOURINARY: No burning on urination, no polyuria NEUROLOGICAL: No headache, dizziness, syncope, paralysis, ataxia, numbness or tingling in the extremities. No change in bowel or bladder control.  MUSCULOSKELETAL: No muscle, back pain, joint pain or stiffness.  LYMPHATICS: No enlarged nodes. No history of splenectomy.  PSYCHIATRIC: No history of depression or anxiety.    ENDOCRINOLOGIC: No reports of sweating, cold or heat intolerance. No polyuria or polydipsia.  Marland Kitchen   Physical Examination Filed Vitals:   05/04/15 1352  BP: 127/71  Pulse: 71   Filed Vitals:   05/04/15 1352  Height: 4\' 11"  (1.499 m)  Weight: 146 lb (66.225 kg)    Gen: resting comfortably, no acute distress HEENT: no scleral icterus, pupils equal round and reactive, no palptable cervical adenopathy,  CV: RRR, no m/r/g, no JVD Resp: Clear to auscultation bilaterally GI: abdomen is soft, non-tender, non-distended, normal bowel sounds, no hepatosplenomegaly MSK: extremities are warm, no edema.  Skin: warm, no rash Neuro:  no focal deficits Psych: appropriate affect   Diagnostic Studies  11/2006 Cath FINDINGS:  1. LV: 133/13/35.  2. No aortic stenosis on pullback.  3. Left ventriculography: This was deferred due to markedly elevated  left ventricular end diastolic pressure.  4. Left main: There is no left main.  5. LAD: A fairly large vessel which wraps the apex of the heart. It  gives rise to a moderate sized diagonal. The proximal LAD is  heavily calcified. There is diffuse 30% stenosis. There is then a  focal 50% stenosis just after the origin of the diagonal.  6. Circumflex: Fairly large codominant vessel. There was a 99%  stenosis proximally and a 60% stenosis just after the marginal  which were stented using two overlapping bare metal stents. Flow  improved from TIMI II to TIMI III.  7. RCA: Relatively small, codominant vessel. There is a 30% stenosis  in the mid vessel just after the origin of acute marginal. The  acute marginal has a 90% ostial stenosis. It is a fairly small  vessel.  RECOMMENDATIONS: Successful percutaneous revascularization of the  proximal circumflex. A small non flow limiting proximal edge dissection  required an additional overlapping stent. There was an excellent final  result. Her left ventricular end diastolic pressure  is markedly  elevated. I, therefore, did not perform ventriculography. We will plan  on continuation of beta blocker in addition to ACE inhibitor. We will  continue aspirin indefinitely. Plavix should be continued for a minimum  of 30 days and preferably nine months given the acute presentation. She  will need echocardiogram to assess left ventricular systolic function.    12/2006 Cath HEMODYNAMIC RESULTS: Aorta 143/74 mmHg, left ventricle 140/28 mmHg.  ANGIOGRAPHIC FINDINGS:  1. There is a very short left main, essentially nearby separate ostia  of the left anterior descending and circumflex coronary arteries.  2. The left anterior descending is a medium to small caliber vessel  with distal tapering and 30% midvessel stenosis. There is  essentially one medium-sized branching diagonal. No flow-limiting  stenoses are  noted.  3. The circumflex coronary artery is a medium to large caliber vessel  that is codominant with a small right coronary artery. Stent site  is visualized from the proximal portion of the vessel and was  widely patent. Otherwise there are minor luminal irregularities.  There is a ramus intermedius and a large multi-branched obtuse  marginal in the midvessel segment.  4. The right coronary artery is small and codominant. There is an  acute marginal visualized with approximately 80% ostial stenosis  which is not a new finding. Otherwise minor luminal irregularities  are noted.  5. Left ventriculography is performed in the RAO projection and  reveals an ejection fraction approximately 75% with no focal  anterior or inferior wall motion abnormality and trace mitral  regurgitation in the setting of ventricular ectopy.  DIAGNOSIS:  1. Mild coronary atherosclerosis as discussed above with widely patent  stent site within the proximal circumflex.  2. Left ventricular ejection fraction approximately 75% with an  increased left ventricular  end-diastolic pressure of 28 mmHg, trace  mitral regurgitation in the setting of ventricular ectopy, and no  significant pullback gradient.  DISCUSSION: I reviewed the results with the patient and with Dr. Dannielle Burn  by phone. At this point I would anticipate continued medical therapy.  Discharge will be planned for later today with follow-up in the [facility]  office with Dr. Dannielle Burn.    07/2013 Carotid US RICA 3-55%, LICA 73-22%, patent antegrade vertebrals  04/2014 echo Study Conclusions  - Left ventricle: The cavity size was normal. Wall thickness was increased in a pattern of mild LVH. Systolic function was normal. The estimated ejection fraction was in the range of 60% to 65%. There is evidence of diastolic dysfunction, indeterminant grade. Wall motion was normal; there were no regional wall motion abnormalities. - Aortic valve: Mildly calcified annulus. Mildly thickened leaflets. There is aortic sclerosis without stenosis. Valve area (VTI): 2.89 cm^2. Valve area (Vmax): 2.58 cm^2. - Mitral valve: There was mild regurgitation. - Technically adequate study.  Jan 2016 Carotid US RICA 0-25%, LICA 42-70%, >62% ECA bilateral, right subclavian stenosis    Assessment and Plan   1. CAD - no current symptoms - continue risk factor modification and secondary prevention  2. PAD - no current symptoms, continue medical therapy  3. HTN - at goal, continue current meds  4. Hyperlipidemia - in setting of known CAD will change to high dose statin with atorva 80mg  daily    F/u 1 year. Request labs from pcp   Arnoldo Lenis, M.D.

## 2015-06-07 ENCOUNTER — Other Ambulatory Visit: Payer: Self-pay | Admitting: *Deleted

## 2015-06-07 MED ORDER — CARVEDILOL 12.5 MG PO TABS: 12.5000 mg | ORAL_TABLET | Freq: Two times a day (BID) | ORAL | Status: DC

## 2015-12-14 DIAGNOSIS — L570 Actinic keratosis: Secondary | ICD-10-CM | POA: Diagnosis not present

## 2015-12-14 DIAGNOSIS — Z85828 Personal history of other malignant neoplasm of skin: Secondary | ICD-10-CM | POA: Diagnosis not present

## 2016-01-13 ENCOUNTER — Other Ambulatory Visit: Payer: Self-pay | Admitting: Cardiology

## 2016-02-07 DIAGNOSIS — M9905 Segmental and somatic dysfunction of pelvic region: Secondary | ICD-10-CM | POA: Diagnosis not present

## 2016-02-07 DIAGNOSIS — M5441 Lumbago with sciatica, right side: Secondary | ICD-10-CM | POA: Diagnosis not present

## 2016-02-07 DIAGNOSIS — M9902 Segmental and somatic dysfunction of thoracic region: Secondary | ICD-10-CM | POA: Diagnosis not present

## 2016-02-07 DIAGNOSIS — M9903 Segmental and somatic dysfunction of lumbar region: Secondary | ICD-10-CM | POA: Diagnosis not present

## 2016-02-09 DIAGNOSIS — M9905 Segmental and somatic dysfunction of pelvic region: Secondary | ICD-10-CM | POA: Diagnosis not present

## 2016-02-09 DIAGNOSIS — M9903 Segmental and somatic dysfunction of lumbar region: Secondary | ICD-10-CM | POA: Diagnosis not present

## 2016-02-09 DIAGNOSIS — M5441 Lumbago with sciatica, right side: Secondary | ICD-10-CM | POA: Diagnosis not present

## 2016-02-09 DIAGNOSIS — M9902 Segmental and somatic dysfunction of thoracic region: Secondary | ICD-10-CM | POA: Diagnosis not present

## 2016-02-15 DIAGNOSIS — M9905 Segmental and somatic dysfunction of pelvic region: Secondary | ICD-10-CM | POA: Diagnosis not present

## 2016-02-15 DIAGNOSIS — M9902 Segmental and somatic dysfunction of thoracic region: Secondary | ICD-10-CM | POA: Diagnosis not present

## 2016-02-15 DIAGNOSIS — M9903 Segmental and somatic dysfunction of lumbar region: Secondary | ICD-10-CM | POA: Diagnosis not present

## 2016-02-15 DIAGNOSIS — M5441 Lumbago with sciatica, right side: Secondary | ICD-10-CM | POA: Diagnosis not present

## 2016-02-21 DIAGNOSIS — M9905 Segmental and somatic dysfunction of pelvic region: Secondary | ICD-10-CM | POA: Diagnosis not present

## 2016-02-21 DIAGNOSIS — M5441 Lumbago with sciatica, right side: Secondary | ICD-10-CM | POA: Diagnosis not present

## 2016-02-21 DIAGNOSIS — M9902 Segmental and somatic dysfunction of thoracic region: Secondary | ICD-10-CM | POA: Diagnosis not present

## 2016-02-21 DIAGNOSIS — M9903 Segmental and somatic dysfunction of lumbar region: Secondary | ICD-10-CM | POA: Diagnosis not present

## 2016-02-28 DIAGNOSIS — M5441 Lumbago with sciatica, right side: Secondary | ICD-10-CM | POA: Diagnosis not present

## 2016-02-28 DIAGNOSIS — M9902 Segmental and somatic dysfunction of thoracic region: Secondary | ICD-10-CM | POA: Diagnosis not present

## 2016-02-28 DIAGNOSIS — M9905 Segmental and somatic dysfunction of pelvic region: Secondary | ICD-10-CM | POA: Diagnosis not present

## 2016-02-28 DIAGNOSIS — M9903 Segmental and somatic dysfunction of lumbar region: Secondary | ICD-10-CM | POA: Diagnosis not present

## 2016-03-27 ENCOUNTER — Other Ambulatory Visit: Payer: Self-pay | Admitting: Cardiology

## 2016-04-28 ENCOUNTER — Ambulatory Visit (INDEPENDENT_AMBULATORY_CARE_PROVIDER_SITE_OTHER): Payer: Medicare Other | Admitting: Cardiology

## 2016-04-28 ENCOUNTER — Encounter: Payer: Self-pay | Admitting: Cardiology

## 2016-04-28 VITALS — BP 119/73 | HR 84 | Ht 59.0 in | Wt 142.0 lb

## 2016-04-28 DIAGNOSIS — I6529 Occlusion and stenosis of unspecified carotid artery: Secondary | ICD-10-CM

## 2016-04-28 DIAGNOSIS — I6523 Occlusion and stenosis of bilateral carotid arteries: Secondary | ICD-10-CM | POA: Diagnosis not present

## 2016-04-28 DIAGNOSIS — I10 Essential (primary) hypertension: Secondary | ICD-10-CM | POA: Diagnosis not present

## 2016-04-28 DIAGNOSIS — I2510 Atherosclerotic heart disease of native coronary artery without angina pectoris: Secondary | ICD-10-CM

## 2016-04-28 DIAGNOSIS — I739 Peripheral vascular disease, unspecified: Secondary | ICD-10-CM

## 2016-04-28 NOTE — Progress Notes (Signed)
Clinical Summary [name] is a 65 y.o.female seen today for follow up of the following medical problems  1. CAD - hx of NSTEMI in 2008, received a BMS to the LCX.  - 04/2014 echo: LVEF 60-65%  - Isolated episode of mild chest pain, no recurrence. - no SOB or DOE.  - compliant with meds. Exercising regularly.   2. PAD - mild left renal artery stenosis, bp controlled with no reported history of renal disease.  - severe ostial SMA stenosis, denies any stomach pain.  - mild to moderate bilateral carotid stenosis by most recent US Jan 2016. Denies any neurological symptoms.  3. HTN - does not check blood pressure regularly at home - compliant with meds  4. Hyperlipidemia - compliant with statin, last visit changed to atorva 80mg  daily in setting of known CAD       SH: recently got back from cruise to [place] Past Medical History  Diagnosis Date  . CAD (coronary artery disease)     NSTEMI/BMS pCFX, 01/08  . PAD (peripheral artery disease)     mild left renal artery stenosis; severe osital SMA stenosis  . CVD (cerebrovascular disease)     non obstructive  . Dyslipidemia   . HTN (hypertension)   . Anxiety and depression   . Colitis      Allergies  Allergen Reactions  . Penicillins     REACTION: ?rash     Current Outpatient Prescriptions  Medication Sig Dispense Refill  . ALPRAZolam (XANAX) 0.25 MG tablet Take 0.25 mg by mouth 2 (two) times daily as needed.      Marland Kitchen amLODipine (NORVASC) 10 MG tablet TAKE 1 TABLET ONCE DAILY 30 tablet 6  . aspirin 81 MG EC tablet Take 81 mg by mouth daily.      Marland Kitchen atorvastatin (LIPITOR) 80 MG tablet TAKE ONE TABLET BY MOUTH DAILY. 90 tablet 0  . carvedilol (COREG) 12.5 MG tablet TAKE ONE TABLET BY MOUTH TWICE DAILY. 60 tablet 3  . Cholecalciferol (VITAMIN D3) 2000 UNITS capsule Take 2,000 Units by mouth daily.    . fluticasone (FLONASE) 50 MCG/ACT nasal spray Place 1 spray into both nostrils daily as needed for allergies or  rhinitis.     . hydrochlorothiazide (HYDRODIURIL) 25 MG tablet TAKE 1 TABLET ONCE DAILY 30 tablet 6  . isosorbide mononitrate (IMDUR) 30 MG 24 hr tablet TAKE ONE TABLET ONCE DAILY 30 tablet 6  . Multiple Vitamins-Minerals (ONE-A-DAY EXTRAS ANTIOXIDANT) CAPS Take 1 capsule by mouth daily.      . Multiple Vitamins-Minerals (PRESERVISION/LUTEIN) CAPS Take 1 capsule by mouth 2 (two) times daily.      . nitroGLYCERIN (NITROSTAT) 0.4 MG SL tablet Place 1 tablet (0.4 mg total) under the tongue every 5 (five) minutes as needed. 25 tablet 3  . potassium chloride SA (K-DUR,KLOR-CON) 20 MEQ tablet Take 30 mEq by mouth 2 (two) times daily.     Marland Kitchen venlafaxine (EFFEXOR-XR) 75 MG 24 hr capsule Take 225 mg by mouth daily.       No current facility-administered medications for this visit.     Past Surgical History  Procedure Laterality Date  . Abdominal hysterectomy      abd?     Allergies  Allergen Reactions  . Penicillins     REACTION: ?rash      Family History  Problem Relation Age of Onset  . Diabetes Neg Hx   . Hypertension Neg Hx   . Coronary artery disease Neg Hx  Social History [name] reports that she has never smoked. She has never used smokeless tobacco. [name] has no alcohol history on file.   Review of Systems CONSTITUTIONAL: No weight loss, fever, chills, weakness or fatigue.  HEENT: Eyes: No visual loss, blurred vision, double vision or yellow sclerae.No hearing loss, sneezing, congestion, runny nose or sore throat.  SKIN: No rash or itching.  CARDIOVASCULAR: per HPI RESPIRATORY: No shortness of breath, cough or sputum.  GASTROINTESTINAL: No anorexia, nausea, vomiting or diarrhea. No abdominal pain or blood.  GENITOURINARY: No burning on urination, no polyuria NEUROLOGICAL: No headache, dizziness, syncope, paralysis, ataxia, numbness or tingling in the extremities. No change in bowel or bladder control.  MUSCULOSKELETAL: No muscle, back pain, joint pain or  stiffness.  LYMPHATICS: No enlarged nodes. No history of splenectomy.  PSYCHIATRIC: No history of depression or anxiety.  ENDOCRINOLOGIC: No reports of sweating, cold or heat intolerance. No polyuria or polydipsia.  Marland Kitchen   Physical Examination Filed Vitals:   04/28/16 1303  BP: 119/73  Pulse: 84   Filed Vitals:   04/28/16 1303  Height: 4\' 11"  (1.499 m)  Weight: 142 lb (64.411 kg)    Gen: resting comfortably, no acute distress HEENT: no scleral icterus, pupils equal round and reactive, no palptable cervical adenopathy,  CV: RRR, no m/r/g, no jvd Resp: Clear to auscultation bilaterally GI: abdomen is soft, non-tender, non-distended, normal bowel sounds, no hepatosplenomegaly MSK: extremities are warm, no edema.  Skin: warm, no rash Neuro:  no focal deficits Psych: appropriate affect   Diagnostic Studies 11/2006 Cath FINDINGS:  1. LV: 133/13/35.  2. No aortic stenosis on pullback.  3. Left ventriculography: This was deferred due to markedly elevated  left ventricular end diastolic pressure.  4. Left main: There is no left main.  5. LAD: A fairly large vessel which wraps the apex of the heart. It  gives rise to a moderate sized diagonal. The proximal LAD is  heavily calcified. There is diffuse 30% stenosis. There is then a  focal 50% stenosis just after the origin of the diagonal.  6. Circumflex: Fairly large codominant vessel. There was a 99%  stenosis proximally and a 60% stenosis just after the marginal  which were stented using two overlapping bare metal stents. Flow  improved from TIMI II to TIMI III.  7. RCA: Relatively small, codominant vessel. There is a 30% stenosis  in the mid vessel just after the origin of acute marginal. The  acute marginal has a 90% ostial stenosis. It is a fairly small  vessel.  RECOMMENDATIONS: Successful percutaneous revascularization of the  proximal circumflex. A small non flow limiting proximal edge dissection   required an additional overlapping stent. There was an excellent final  result. Her left ventricular end diastolic pressure is markedly  elevated. I, therefore, did not perform ventriculography. We will plan  on continuation of beta blocker in addition to ACE inhibitor. We will  continue aspirin indefinitely. Plavix should be continued for a minimum  of 30 days and preferably nine months given the acute presentation. She  will need echocardiogram to assess left ventricular systolic function.    12/2006 Cath HEMODYNAMIC RESULTS: Aorta 143/74 mmHg, left ventricle 140/28 mmHg.  ANGIOGRAPHIC FINDINGS:  1. There is a very short left main, essentially nearby separate ostia  of the left anterior descending and circumflex coronary arteries.  2. The left anterior descending is a medium to small caliber vessel  with distal tapering and 30% midvessel stenosis. There is  essentially one medium-sized branching diagonal. No flow-limiting  stenoses are noted.  3. The circumflex coronary artery is a medium to large caliber vessel  that is codominant with a small right coronary artery. Stent site  is visualized from the proximal portion of the vessel and was  widely patent. Otherwise there are minor luminal irregularities.  There is a ramus intermedius and a large multi-branched obtuse  marginal in the midvessel segment.  4. The right coronary artery is small and codominant. There is an  acute marginal visualized with approximately 80% ostial stenosis  which is not a new finding. Otherwise minor luminal irregularities  are noted.  5. Left ventriculography is performed in the RAO projection and  reveals an ejection fraction approximately 75% with no focal  anterior or inferior wall motion abnormality and trace mitral  regurgitation in the setting of ventricular ectopy.  DIAGNOSIS:  1. Mild coronary atherosclerosis as discussed above with widely patent  stent site  within the proximal circumflex.  2. Left ventricular ejection fraction approximately 75% with an  increased left ventricular end-diastolic pressure of 28 mmHg, trace  mitral regurgitation in the setting of ventricular ectopy, and no  significant pullback gradient.  DISCUSSION: I reviewed the results with the patient and with Dr. Dannielle Burn  by phone. At this point I would anticipate continued medical therapy.  Discharge will be planned for later today with follow-up in the [facility]  office with Dr. Dannielle Burn.    07/2013 Carotid US RICA 123456, LICA 123456, patent antegrade vertebrals  04/2014 echo Study Conclusions  - Left ventricle: The cavity size was normal. Wall thickness was increased in a pattern of mild LVH. Systolic function was normal. The estimated ejection fraction was in the range of 60% to 65%. There is evidence of diastolic dysfunction, indeterminant grade. Wall motion was normal; there were no regional wall motion abnormalities. - Aortic valve: Mildly calcified annulus. Mildly thickened leaflets. There is aortic sclerosis without stenosis. Valve area (VTI): 2.89 cm^2. Valve area (Vmax): 2.58 cm^2. - Mitral valve: There was mild regurgitation. - Technically adequate study.  Jan 2016 Carotid US RICA 123456, LICA 123456, [id number] ECA bilateral, right subclavian stenosis    Assessment and Plan  1. CAD - no current symptoms - we will conitnue current meds  2. PAD - no current symptoms, continue secondary prevention - repeat carotid US for carotid stenosis   3. HTN - at goal, he will continue current meds  4. Hyperlipidemia - in setting of CAD and PAD continue high dose statin         Arnoldo Lenis, M.D.

## 2016-04-28 NOTE — Patient Instructions (Addendum)
Medication Instructions:    Labwork:   Testing/Procedures: Your physician has requested that you have a carotid duplex. This test is an ultrasound of the carotid arteries in your neck. It looks at blood flow through these arteries that supply the brain with blood. Allow one hour for this exam. There are no restrictions or special instructions. Office will contact with results via phone or letter.     Follow-Up: 1 year   Any Other Special Instructions Will Be Listed Below (If Applicable).  If you need a refill on your cardiac medications before your next appointment, please call your pharmacy.

## 2016-05-01 ENCOUNTER — Encounter: Payer: Self-pay | Admitting: *Deleted

## 2016-05-03 ENCOUNTER — Ambulatory Visit: Payer: No Typology Code available for payment source | Admitting: Cardiology

## 2016-05-04 ENCOUNTER — Ambulatory Visit (INDEPENDENT_AMBULATORY_CARE_PROVIDER_SITE_OTHER): Payer: Medicare Other

## 2016-05-04 DIAGNOSIS — I6523 Occlusion and stenosis of bilateral carotid arteries: Secondary | ICD-10-CM | POA: Diagnosis not present

## 2016-05-04 DIAGNOSIS — I6529 Occlusion and stenosis of unspecified carotid artery: Secondary | ICD-10-CM

## 2016-05-09 ENCOUNTER — Telehealth: Payer: Self-pay | Admitting: *Deleted

## 2016-05-09 NOTE — Telephone Encounter (Signed)
Pt aware, routed to pcp 

## 2016-05-09 NOTE — Telephone Encounter (Signed)
-----   Message from Arnoldo Lenis, MD sent at 05/05/2016  2:00 PM EDT ----- Carotid US shows mild to moderate blockages, we will continue to monitor  J BrancH MD

## 2016-05-20 ENCOUNTER — Other Ambulatory Visit: Payer: Self-pay | Admitting: Cardiology

## 2016-07-24 ENCOUNTER — Other Ambulatory Visit: Payer: Self-pay | Admitting: Cardiology

## 2016-08-14 DIAGNOSIS — Z1231 Encounter for screening mammogram for malignant neoplasm of breast: Secondary | ICD-10-CM | POA: Diagnosis not present

## 2016-08-25 DIAGNOSIS — Z1211 Encounter for screening for malignant neoplasm of colon: Secondary | ICD-10-CM | POA: Diagnosis not present

## 2016-08-25 DIAGNOSIS — Z23 Encounter for immunization: Secondary | ICD-10-CM | POA: Diagnosis not present

## 2016-08-25 DIAGNOSIS — F5104 Psychophysiologic insomnia: Secondary | ICD-10-CM | POA: Diagnosis not present

## 2016-08-25 DIAGNOSIS — E559 Vitamin D deficiency, unspecified: Secondary | ICD-10-CM | POA: Diagnosis not present

## 2016-08-25 DIAGNOSIS — Z0000 Encounter for general adult medical examination without abnormal findings: Secondary | ICD-10-CM | POA: Diagnosis not present

## 2016-08-25 DIAGNOSIS — Z124 Encounter for screening for malignant neoplasm of cervix: Secondary | ICD-10-CM | POA: Diagnosis not present

## 2016-08-25 DIAGNOSIS — Z6829 Body mass index (BMI) 29.0-29.9, adult: Secondary | ICD-10-CM | POA: Diagnosis not present

## 2016-08-25 DIAGNOSIS — Z1231 Encounter for screening mammogram for malignant neoplasm of breast: Secondary | ICD-10-CM | POA: Diagnosis not present

## 2016-08-25 DIAGNOSIS — E7801 Familial hypercholesterolemia: Secondary | ICD-10-CM | POA: Diagnosis not present

## 2016-08-25 DIAGNOSIS — I10 Essential (primary) hypertension: Secondary | ICD-10-CM | POA: Diagnosis not present

## 2016-08-25 DIAGNOSIS — J3089 Other allergic rhinitis: Secondary | ICD-10-CM | POA: Diagnosis not present

## 2016-08-25 DIAGNOSIS — E784 Other hyperlipidemia: Secondary | ICD-10-CM | POA: Diagnosis not present

## 2016-08-26 DIAGNOSIS — I10 Essential (primary) hypertension: Secondary | ICD-10-CM | POA: Diagnosis not present

## 2016-08-26 DIAGNOSIS — E7801 Familial hypercholesterolemia: Secondary | ICD-10-CM | POA: Diagnosis not present

## 2016-09-27 DIAGNOSIS — H2513 Age-related nuclear cataract, bilateral: Secondary | ICD-10-CM | POA: Diagnosis not present

## 2016-09-27 DIAGNOSIS — H524 Presbyopia: Secondary | ICD-10-CM | POA: Diagnosis not present

## 2016-09-27 DIAGNOSIS — H25013 Cortical age-related cataract, bilateral: Secondary | ICD-10-CM | POA: Diagnosis not present

## 2016-09-27 DIAGNOSIS — H52223 Regular astigmatism, bilateral: Secondary | ICD-10-CM | POA: Diagnosis not present

## 2016-09-27 DIAGNOSIS — H5203 Hypermetropia, bilateral: Secondary | ICD-10-CM | POA: Diagnosis not present

## 2016-09-27 DIAGNOSIS — H353133 Nonexudative age-related macular degeneration, bilateral, advanced atrophic without subfoveal involvement: Secondary | ICD-10-CM | POA: Diagnosis not present

## 2016-09-28 ENCOUNTER — Other Ambulatory Visit: Payer: Self-pay | Admitting: Cardiology

## 2016-11-08 DIAGNOSIS — H3540 Unspecified peripheral retinal degeneration: Secondary | ICD-10-CM | POA: Diagnosis not present

## 2016-11-08 DIAGNOSIS — H11153 Pinguecula, bilateral: Secondary | ICD-10-CM | POA: Diagnosis not present

## 2016-11-08 DIAGNOSIS — I10 Essential (primary) hypertension: Secondary | ICD-10-CM | POA: Diagnosis not present

## 2016-11-08 DIAGNOSIS — H25013 Cortical age-related cataract, bilateral: Secondary | ICD-10-CM | POA: Diagnosis not present

## 2016-11-08 DIAGNOSIS — H2513 Age-related nuclear cataract, bilateral: Secondary | ICD-10-CM | POA: Diagnosis not present

## 2016-11-08 DIAGNOSIS — H35443 Age-related reticular degeneration of retina, bilateral: Secondary | ICD-10-CM | POA: Diagnosis not present

## 2016-11-08 DIAGNOSIS — H353133 Nonexudative age-related macular degeneration, bilateral, advanced atrophic without subfoveal involvement: Secondary | ICD-10-CM | POA: Diagnosis not present

## 2016-11-08 DIAGNOSIS — H35033 Hypertensive retinopathy, bilateral: Secondary | ICD-10-CM | POA: Diagnosis not present

## 2016-12-13 DIAGNOSIS — Z85828 Personal history of other malignant neoplasm of skin: Secondary | ICD-10-CM | POA: Diagnosis not present

## 2016-12-13 DIAGNOSIS — L570 Actinic keratosis: Secondary | ICD-10-CM | POA: Diagnosis not present

## 2017-01-26 ENCOUNTER — Other Ambulatory Visit: Payer: Self-pay | Admitting: Cardiology

## 2017-02-05 DIAGNOSIS — J309 Allergic rhinitis, unspecified: Secondary | ICD-10-CM | POA: Diagnosis not present

## 2017-02-05 DIAGNOSIS — R490 Dysphonia: Secondary | ICD-10-CM | POA: Diagnosis not present

## 2017-04-30 ENCOUNTER — Encounter: Payer: Self-pay | Admitting: Cardiology

## 2017-04-30 ENCOUNTER — Encounter: Payer: Self-pay | Admitting: *Deleted

## 2017-04-30 ENCOUNTER — Ambulatory Visit (INDEPENDENT_AMBULATORY_CARE_PROVIDER_SITE_OTHER): Payer: Medicare Other | Admitting: Cardiology

## 2017-04-30 VITALS — BP 137/69 | HR 77 | Ht 59.0 in | Wt 148.0 lb

## 2017-04-30 DIAGNOSIS — E782 Mixed hyperlipidemia: Secondary | ICD-10-CM | POA: Diagnosis not present

## 2017-04-30 DIAGNOSIS — I739 Peripheral vascular disease, unspecified: Secondary | ICD-10-CM

## 2017-04-30 DIAGNOSIS — I2510 Atherosclerotic heart disease of native coronary artery without angina pectoris: Secondary | ICD-10-CM

## 2017-04-30 DIAGNOSIS — I10 Essential (primary) hypertension: Secondary | ICD-10-CM

## 2017-04-30 NOTE — Progress Notes (Signed)
Clinical Summary [name] is a 66 y.o.female seen today for follow up of the following medical problems  1. CAD - hx of NSTEMI in 2008, received a BMS to the LCX.  - 04/2014 echo: LVEF 60-65%   - no recent chest pain. No SOB/DOE - compliant with meds  2. PAD - mild left renal artery stenosis, bp controlled with no reported history of renal disease.  - severe ostial SMA stenosis, denies any stomach pain.  - mild to moderate bilateral carotid stenosis by most recent US Jan 2016. Denies any neurological symptoms.  - no recent abdominal pain. No recent neuro symptoms  3. HTN - compliant with meds  4. Hyperlipidemia - compliant with statinD    SH: upcoming trip to [facility]. Enjoys cruises, has been on a total of 3 over the last few years    Past Medical History:  Diagnosis Date  . Anxiety and depression   . CAD (coronary artery disease)    NSTEMI/BMS pCFX, 01/08  . Colitis   . CVD (cerebrovascular disease)    non obstructive  . Dyslipidemia   . HTN (hypertension)   . PAD (peripheral artery disease) (HCC)    mild left renal artery stenosis; severe osital SMA stenosis     Allergies  Allergen Reactions  . Penicillins     REACTION: ?rash     Current Outpatient Prescriptions  Medication Sig Dispense Refill  . ALPRAZolam (XANAX) 0.25 MG tablet Take 0.25 mg by mouth 2 (two) times daily as needed.      Marland Kitchen amLODipine (NORVASC) 10 MG tablet TAKE 1 TABLET ONCE DAILY 30 tablet 6  . aspirin 81 MG EC tablet Take 81 mg by mouth daily.      Marland Kitchen atorvastatin (LIPITOR) 80 MG tablet TAKE ONE TABLET BY MOUTH DAILY 90 tablet 3  . carvedilol (COREG) 12.5 MG tablet TAKE ONE TABLET BY MOUTH TWICE DAILY. 60 tablet 3  . Cholecalciferol (VITAMIN D3) 2000 UNITS capsule Take 2,000 Units by mouth daily.    Marland Kitchen CINNAMON PO Take 1,000 mg by mouth 2 (two) times daily.    . fluticasone (FLONASE) 50 MCG/ACT nasal spray Place 1 spray into both nostrils daily as needed for  allergies or rhinitis.     . hydrochlorothiazide (HYDRODIURIL) 25 MG tablet TAKE 1 TABLET ONCE DAILY 30 tablet 6  . isosorbide mononitrate (IMDUR) 30 MG 24 hr tablet TAKE ONE TABLET ONCE DAILY 30 tablet 6  . magnesium oxide (MAG-OX) 400 MG tablet Take 400 mg by mouth 2 (two) times daily.    . Multiple Vitamins-Minerals (ONE-A-DAY EXTRAS ANTIOXIDANT) CAPS Take 1 capsule by mouth daily.      . Multiple Vitamins-Minerals (PRESERVISION/LUTEIN) CAPS Take 1 capsule by mouth 2 (two) times daily.      . nitroGLYCERIN (NITROSTAT) 0.4 MG SL tablet Place 1 tablet (0.4 mg total) under the tongue every 5 (five) minutes as needed. 25 tablet 3  . Omega-3 Fatty Acids (FISH OIL) 1200 MG CAPS Take 1 capsule by mouth daily.    . potassium chloride SA (K-DUR,KLOR-CON) 20 MEQ tablet Take 30 mEq by mouth 2 (two) times daily.     Marland Kitchen venlafaxine (EFFEXOR-XR) 75 MG 24 hr capsule Take 225 mg by mouth daily.       No current facility-administered medications for this visit.      Past Surgical History:  Procedure Laterality Date  . ABDOMINAL HYSTERECTOMY     abd?     Allergies  Allergen Reactions  .  Penicillins     REACTION: ?rash      Family History  Problem Relation Age of Onset  . Diabetes Neg Hx   . Hypertension Neg Hx   . Coronary artery disease Neg Hx      Social History [name] reports that she has never smoked. She has never used smokeless tobacco. [name] has no alcohol history on file.   Review of Systems CONSTITUTIONAL: No weight loss, fever, chills, weakness or fatigue.  HEENT: Eyes: No visual loss, blurred vision, double vision or yellow sclerae.No hearing loss, sneezing, congestion, runny nose or sore throat.  SKIN: No rash or itching.  CARDIOVASCULAR: per hpi RESPIRATORY: No shortness of breath, cough or sputum.  GASTROINTESTINAL: No anorexia, nausea, vomiting or diarrhea. No abdominal pain or blood.  GENITOURINARY: No burning on urination, no polyuria NEUROLOGICAL: No  headache, dizziness, syncope, paralysis, ataxia, numbness or tingling in the extremities. No change in bowel or bladder control.  MUSCULOSKELETAL: No muscle, back pain, joint pain or stiffness.  LYMPHATICS: No enlarged nodes. No history of splenectomy.  PSYCHIATRIC: No history of depression or anxiety.  ENDOCRINOLOGIC: No reports of sweating, cold or heat intolerance. No polyuria or polydipsia.  Marland Kitchen   Physical Examination Vitals:   04/30/17 1352  BP: 137/69  Pulse: 77   Vitals:   04/30/17 1352  Weight: 148 lb (67.1 kg)  Height: 4\' 11"  (1.499 m)    Gen: resting comfortably, no acute distress HEENT: no scleral icterus, pupils equal round and reactive, no palptable cervical adenopathy,  CV: RRR, 2/6 systolic murmur RUSB, no jvd Resp: Clear to auscultation bilaterally GI: abdomen is soft, non-tender, non-distended, normal bowel sounds, no hepatosplenomegaly MSK: extremities are warm, no edema.  Skin: warm, no rash Neuro:  no focal deficits Psych: appropriate affect   Diagnostic Studies  11/2006 Cath FINDINGS:  1. LV: 133/13/35.  2. No aortic stenosis on pullback.  3. Left ventriculography: This was deferred due to markedly elevated  left ventricular end diastolic pressure.  4. Left main: There is no left main.  5. LAD: A fairly large vessel which wraps the apex of the heart. It  gives rise to a moderate sized diagonal. The proximal LAD is  heavily calcified. There is diffuse 30% stenosis. There is then a  focal 50% stenosis just after the origin of the diagonal.  6. Circumflex: Fairly large codominant vessel. There was a 99%  stenosis proximally and a 60% stenosis just after the marginal  which were stented using two overlapping bare metal stents. Flow  improved from TIMI II to TIMI III.  7. RCA: Relatively small, codominant vessel. There is a 30% stenosis  in the mid vessel just after the origin of acute marginal. The  acute marginal has a 90% ostial  stenosis. It is a fairly small  vessel.  RECOMMENDATIONS: Successful percutaneous revascularization of the  proximal circumflex. A small non flow limiting proximal edge dissection  required an additional overlapping stent. There was an excellent final  result. Her left ventricular end diastolic pressure is markedly  elevated. I, therefore, did not perform ventriculography. We will plan  on continuation of beta blocker in addition to ACE inhibitor. We will  continue aspirin indefinitely. Plavix should be continued for a minimum  of 30 days and preferably nine months given the acute presentation. She  will need echocardiogram to assess left ventricular systolic function.  12/2006 Cath HEMODYNAMIC RESULTS: Aorta 143/74 mmHg, left ventricle 140/28 mmHg.  ANGIOGRAPHIC FINDINGS:  1. There is  a very short left main, essentially nearby separate ostia  of the left anterior descending and circumflex coronary arteries.  2. The left anterior descending is a medium to small caliber vessel  with distal tapering and 30% midvessel stenosis. There is  essentially one medium-sized branching diagonal. No flow-limiting  stenoses are noted.  3. The circumflex coronary artery is a medium to large caliber vessel  that is codominant with a small right coronary artery. Stent site  is visualized from the proximal portion of the vessel and was  widely patent. Otherwise there are minor luminal irregularities.  There is a ramus intermedius and a large multi-branched obtuse  marginal in the midvessel segment.  4. The right coronary artery is small and codominant. There is an  acute marginal visualized with approximately 80% ostial stenosis  which is not a new finding. Otherwise minor luminal irregularities  are noted.  5. Left ventriculography is performed in the RAO projection and  reveals an ejection fraction approximately 75% with no focal  anterior or inferior wall motion  abnormality and trace mitral  regurgitation in the setting of ventricular ectopy.  DIAGNOSIS:  1. Mild coronary atherosclerosis as discussed above with widely patent  stent site within the proximal circumflex.  2. Left ventricular ejection fraction approximately 75% with an  increased left ventricular end-diastolic pressure of 28 mmHg, trace  mitral regurgitation in the setting of ventricular ectopy, and no  significant pullback gradient.  DISCUSSION: I reviewed the results with the patient and with Dr. Dannielle Burn  by phone. At this point I would anticipate continued medical therapy.  Discharge will be planned for later today with follow-up in the [facility]  office with Dr. Dannielle Burn.    07/2013 Carotid US RICA 2-62%, LICA 03-55%, patent antegrade vertebrals  04/2014 echo Study Conclusions  - Left ventricle: The cavity size was normal. Wall thickness was increased in a pattern of mild LVH. Systolic function was normal. The estimated ejection fraction was in the range of 60% to 65%. There is evidence of diastolic dysfunction, indeterminant grade. Wall motion was normal; there were no regional wall motion abnormalities. - Aortic valve: Mildly calcified annulus. Mildly thickened leaflets. There is aortic sclerosis without stenosis. Valve area (VTI): 2.89 cm^2. Valve area (Vmax): 2.58 cm^2. - Mitral valve: There was mild regurgitation. - Technically adequate study.  Jan 2016 Carotid US RICA 9-74%, LICA 16-38%, >45% ECA bilateral, right subclavian stenosis  04/2016 Carotid US RICA 3-64%, LICA 68-03%   Assessment and Plan  1. CAD - no symptoms, continue current meds - EKG in clinic SR, no ischemic changes  2. PAD - no current symptoms - continue to monitor at this time   3. HTN -her bp is at goal, continue current meds  4. Hyperlipidemia - request labs from pcp - continue statin.       Arnoldo Lenis, M.D.

## 2017-04-30 NOTE — Patient Instructions (Signed)

## 2017-05-01 DIAGNOSIS — Z6830 Body mass index (BMI) 30.0-30.9, adult: Secondary | ICD-10-CM | POA: Diagnosis not present

## 2017-05-01 DIAGNOSIS — F329 Major depressive disorder, single episode, unspecified: Secondary | ICD-10-CM | POA: Diagnosis not present

## 2017-05-01 DIAGNOSIS — I6522 Occlusion and stenosis of left carotid artery: Secondary | ICD-10-CM | POA: Diagnosis not present

## 2017-05-01 DIAGNOSIS — Z299 Encounter for prophylactic measures, unspecified: Secondary | ICD-10-CM | POA: Diagnosis not present

## 2017-05-01 DIAGNOSIS — E7800 Pure hypercholesterolemia, unspecified: Secondary | ICD-10-CM | POA: Diagnosis not present

## 2017-05-01 DIAGNOSIS — I2510 Atherosclerotic heart disease of native coronary artery without angina pectoris: Secondary | ICD-10-CM | POA: Diagnosis not present

## 2017-05-01 DIAGNOSIS — Z713 Dietary counseling and surveillance: Secondary | ICD-10-CM | POA: Diagnosis not present

## 2017-05-01 DIAGNOSIS — I10 Essential (primary) hypertension: Secondary | ICD-10-CM | POA: Diagnosis not present

## 2017-06-25 ENCOUNTER — Other Ambulatory Visit: Payer: Self-pay | Admitting: Cardiology

## 2017-06-29 ENCOUNTER — Other Ambulatory Visit: Payer: Self-pay | Admitting: Cardiology

## 2017-07-02 DIAGNOSIS — L570 Actinic keratosis: Secondary | ICD-10-CM | POA: Diagnosis not present

## 2017-07-02 DIAGNOSIS — Z85828 Personal history of other malignant neoplasm of skin: Secondary | ICD-10-CM | POA: Diagnosis not present

## 2017-08-23 DIAGNOSIS — F411 Generalized anxiety disorder: Secondary | ICD-10-CM | POA: Diagnosis not present

## 2017-08-23 DIAGNOSIS — Z6830 Body mass index (BMI) 30.0-30.9, adult: Secondary | ICD-10-CM | POA: Diagnosis not present

## 2017-08-23 DIAGNOSIS — E782 Mixed hyperlipidemia: Secondary | ICD-10-CM | POA: Diagnosis not present

## 2017-08-23 DIAGNOSIS — I2510 Atherosclerotic heart disease of native coronary artery without angina pectoris: Secondary | ICD-10-CM | POA: Diagnosis not present

## 2017-08-23 DIAGNOSIS — Z299 Encounter for prophylactic measures, unspecified: Secondary | ICD-10-CM | POA: Diagnosis not present

## 2017-08-23 DIAGNOSIS — I10 Essential (primary) hypertension: Secondary | ICD-10-CM | POA: Diagnosis not present

## 2017-08-31 DIAGNOSIS — Z299 Encounter for prophylactic measures, unspecified: Secondary | ICD-10-CM | POA: Diagnosis not present

## 2017-08-31 DIAGNOSIS — R5383 Other fatigue: Secondary | ICD-10-CM | POA: Diagnosis not present

## 2017-08-31 DIAGNOSIS — E7800 Pure hypercholesterolemia, unspecified: Secondary | ICD-10-CM | POA: Diagnosis not present

## 2017-08-31 DIAGNOSIS — Z79899 Other long term (current) drug therapy: Secondary | ICD-10-CM | POA: Diagnosis not present

## 2017-08-31 DIAGNOSIS — Z1211 Encounter for screening for malignant neoplasm of colon: Secondary | ICD-10-CM | POA: Diagnosis not present

## 2017-08-31 DIAGNOSIS — Z1331 Encounter for screening for depression: Secondary | ICD-10-CM | POA: Diagnosis not present

## 2017-08-31 DIAGNOSIS — Z7189 Other specified counseling: Secondary | ICD-10-CM | POA: Diagnosis not present

## 2017-08-31 DIAGNOSIS — Z1339 Encounter for screening examination for other mental health and behavioral disorders: Secondary | ICD-10-CM | POA: Diagnosis not present

## 2017-08-31 DIAGNOSIS — Z0000 Encounter for general adult medical examination without abnormal findings: Secondary | ICD-10-CM | POA: Diagnosis not present

## 2017-08-31 DIAGNOSIS — E663 Overweight: Secondary | ICD-10-CM | POA: Diagnosis not present

## 2017-08-31 DIAGNOSIS — F411 Generalized anxiety disorder: Secondary | ICD-10-CM | POA: Diagnosis not present

## 2017-08-31 DIAGNOSIS — Z23 Encounter for immunization: Secondary | ICD-10-CM | POA: Diagnosis not present

## 2017-09-04 DIAGNOSIS — Z299 Encounter for prophylactic measures, unspecified: Secondary | ICD-10-CM | POA: Diagnosis not present

## 2017-09-04 DIAGNOSIS — Z6829 Body mass index (BMI) 29.0-29.9, adult: Secondary | ICD-10-CM | POA: Diagnosis not present

## 2017-09-04 DIAGNOSIS — I6529 Occlusion and stenosis of unspecified carotid artery: Secondary | ICD-10-CM | POA: Diagnosis not present

## 2017-09-04 DIAGNOSIS — R7989 Other specified abnormal findings of blood chemistry: Secondary | ICD-10-CM | POA: Diagnosis not present

## 2017-09-04 DIAGNOSIS — I2510 Atherosclerotic heart disease of native coronary artery without angina pectoris: Secondary | ICD-10-CM | POA: Diagnosis not present

## 2017-09-04 DIAGNOSIS — R739 Hyperglycemia, unspecified: Secondary | ICD-10-CM | POA: Diagnosis not present

## 2017-09-04 DIAGNOSIS — I10 Essential (primary) hypertension: Secondary | ICD-10-CM | POA: Diagnosis not present

## 2017-09-04 DIAGNOSIS — E7800 Pure hypercholesterolemia, unspecified: Secondary | ICD-10-CM | POA: Diagnosis not present

## 2017-09-04 DIAGNOSIS — Z713 Dietary counseling and surveillance: Secondary | ICD-10-CM | POA: Diagnosis not present

## 2017-09-17 DIAGNOSIS — E782 Mixed hyperlipidemia: Secondary | ICD-10-CM | POA: Diagnosis not present

## 2017-09-17 DIAGNOSIS — R195 Other fecal abnormalities: Secondary | ICD-10-CM | POA: Diagnosis not present

## 2017-09-17 DIAGNOSIS — Z299 Encounter for prophylactic measures, unspecified: Secondary | ICD-10-CM | POA: Diagnosis not present

## 2017-09-17 DIAGNOSIS — Z713 Dietary counseling and surveillance: Secondary | ICD-10-CM | POA: Diagnosis not present

## 2017-09-17 DIAGNOSIS — Z6829 Body mass index (BMI) 29.0-29.9, adult: Secondary | ICD-10-CM | POA: Diagnosis not present

## 2017-09-25 DIAGNOSIS — E2839 Other primary ovarian failure: Secondary | ICD-10-CM | POA: Diagnosis not present

## 2017-09-26 DIAGNOSIS — R195 Other fecal abnormalities: Secondary | ICD-10-CM | POA: Diagnosis not present

## 2017-10-02 DIAGNOSIS — E782 Mixed hyperlipidemia: Secondary | ICD-10-CM | POA: Diagnosis not present

## 2017-10-02 DIAGNOSIS — E7800 Pure hypercholesterolemia, unspecified: Secondary | ICD-10-CM | POA: Diagnosis not present

## 2017-10-03 DIAGNOSIS — R195 Other fecal abnormalities: Secondary | ICD-10-CM | POA: Diagnosis not present

## 2017-10-17 DIAGNOSIS — R195 Other fecal abnormalities: Secondary | ICD-10-CM | POA: Diagnosis not present

## 2017-10-23 DIAGNOSIS — Z1231 Encounter for screening mammogram for malignant neoplasm of breast: Secondary | ICD-10-CM | POA: Diagnosis not present

## 2017-11-23 DIAGNOSIS — I6529 Occlusion and stenosis of unspecified carotid artery: Secondary | ICD-10-CM | POA: Diagnosis not present

## 2017-11-23 DIAGNOSIS — F411 Generalized anxiety disorder: Secondary | ICD-10-CM | POA: Diagnosis not present

## 2017-11-23 DIAGNOSIS — F329 Major depressive disorder, single episode, unspecified: Secondary | ICD-10-CM | POA: Diagnosis not present

## 2017-11-23 DIAGNOSIS — Z299 Encounter for prophylactic measures, unspecified: Secondary | ICD-10-CM | POA: Diagnosis not present

## 2017-11-23 DIAGNOSIS — I10 Essential (primary) hypertension: Secondary | ICD-10-CM | POA: Diagnosis not present

## 2017-11-23 DIAGNOSIS — Z6829 Body mass index (BMI) 29.0-29.9, adult: Secondary | ICD-10-CM | POA: Diagnosis not present

## 2017-11-23 DIAGNOSIS — E782 Mixed hyperlipidemia: Secondary | ICD-10-CM | POA: Diagnosis not present

## 2017-11-23 DIAGNOSIS — I2510 Atherosclerotic heart disease of native coronary artery without angina pectoris: Secondary | ICD-10-CM | POA: Diagnosis not present

## 2017-12-17 DIAGNOSIS — Z85828 Personal history of other malignant neoplasm of skin: Secondary | ICD-10-CM | POA: Diagnosis not present

## 2017-12-17 DIAGNOSIS — L570 Actinic keratosis: Secondary | ICD-10-CM | POA: Diagnosis not present

## 2018-01-29 ENCOUNTER — Other Ambulatory Visit: Payer: Self-pay | Admitting: Cardiology

## 2018-02-21 DIAGNOSIS — Z6829 Body mass index (BMI) 29.0-29.9, adult: Secondary | ICD-10-CM | POA: Diagnosis not present

## 2018-02-21 DIAGNOSIS — I10 Essential (primary) hypertension: Secondary | ICD-10-CM | POA: Diagnosis not present

## 2018-02-21 DIAGNOSIS — I6529 Occlusion and stenosis of unspecified carotid artery: Secondary | ICD-10-CM | POA: Diagnosis not present

## 2018-02-21 DIAGNOSIS — E782 Mixed hyperlipidemia: Secondary | ICD-10-CM | POA: Diagnosis not present

## 2018-02-21 DIAGNOSIS — I2510 Atherosclerotic heart disease of native coronary artery without angina pectoris: Secondary | ICD-10-CM | POA: Diagnosis not present

## 2018-02-21 DIAGNOSIS — Z299 Encounter for prophylactic measures, unspecified: Secondary | ICD-10-CM | POA: Diagnosis not present

## 2018-02-21 DIAGNOSIS — F411 Generalized anxiety disorder: Secondary | ICD-10-CM | POA: Diagnosis not present

## 2018-03-28 DIAGNOSIS — H353133 Nonexudative age-related macular degeneration, bilateral, advanced atrophic without subfoveal involvement: Secondary | ICD-10-CM | POA: Diagnosis not present

## 2018-04-30 ENCOUNTER — Ambulatory Visit (INDEPENDENT_AMBULATORY_CARE_PROVIDER_SITE_OTHER): Payer: Medicare Other | Admitting: Cardiology

## 2018-04-30 ENCOUNTER — Encounter: Payer: Self-pay | Admitting: Cardiology

## 2018-04-30 VITALS — BP 121/69 | HR 76 | Ht 59.0 in | Wt 144.4 lb

## 2018-04-30 DIAGNOSIS — I2510 Atherosclerotic heart disease of native coronary artery without angina pectoris: Secondary | ICD-10-CM | POA: Diagnosis not present

## 2018-04-30 DIAGNOSIS — E782 Mixed hyperlipidemia: Secondary | ICD-10-CM | POA: Diagnosis not present

## 2018-04-30 DIAGNOSIS — I739 Peripheral vascular disease, unspecified: Secondary | ICD-10-CM

## 2018-04-30 DIAGNOSIS — I10 Essential (primary) hypertension: Secondary | ICD-10-CM

## 2018-04-30 NOTE — Progress Notes (Signed)
Clinical Summary Ms. Nadeau is a 67 y.o.female seen today for follow up of the following medical problems  1. CAD - hx of NSTEMI in 2008, received a BMS to the LCX.  - 04/2014 echo: LVEF 60-65%   No chest, no SOB/DOE - compliant with meds   2. PAD - mild left renal artery stenosis, bp controlled with no reported history of renal disease.  - severe ostial SMA stenosis, denies any stomach pain.  - mild to moderate bilateral carotid stenosis by most recent US Jan 2016. Denies any neurological symptoms.   - no recent stomach pains.   3. HTN - compliant with meds   4. Hyperlipidemia - she is compliant with statin.     SH: upcoming trip 59 year anniversary, taking a cruise to [place]  Past Medical History:  Diagnosis Date  . Anxiety and depression   . CAD (coronary artery disease)    NSTEMI/BMS pCFX, 01/08  . Colitis   . CVD (cerebrovascular disease)    non obstructive  . Dyslipidemia   . HTN (hypertension)   . PAD (peripheral artery disease) (HCC)    mild left renal artery stenosis; severe osital SMA stenosis     Allergies  Allergen Reactions  . Penicillins     REACTION: ?rash     Current Outpatient Medications  Medication Sig Dispense Refill  . ALPRAZolam (XANAX) 0.25 MG tablet Take 0.25 mg by mouth 2 (two) times daily as needed.      Marland Kitchen amLODipine (NORVASC) 10 MG tablet TAKE 1 TABLET ONCE DAILY 30 tablet 6  . aspirin 81 MG EC tablet Take 81 mg by mouth daily.      Marland Kitchen atorvastatin (LIPITOR) 80 MG tablet TAKE ONE TABLET BY MOUTH DAILY 90 tablet 3  . carvedilol (COREG) 12.5 MG tablet TAKE ONE TABLET BY MOUTH TWICE DAILY. 60 tablet 3  . Cholecalciferol (VITAMIN D3) 2000 UNITS capsule Take 2,000 Units by mouth daily.    Marland Kitchen CINNAMON PO Take 1,000 mg by mouth 2 (two) times daily.    . fluticasone (FLONASE) 50 MCG/ACT nasal spray Place 1 spray into both nostrils daily as needed for allergies or rhinitis.     . hydrochlorothiazide (HYDRODIURIL) 25 MG  tablet TAKE 1 TABLET ONCE DAILY 30 tablet 6  . isosorbide mononitrate (IMDUR) 30 MG 24 hr tablet TAKE ONE TABLET ONCE DAILY 30 tablet 6  . magnesium oxide (MAG-OX) 400 MG tablet Take 400 mg by mouth 2 (two) times daily.    . Multiple Vitamins-Minerals (ONE-A-DAY EXTRAS ANTIOXIDANT) CAPS Take 1 capsule by mouth daily.      . Multiple Vitamins-Minerals (PRESERVISION/LUTEIN) CAPS Take 1 capsule by mouth 2 (two) times daily.      . nitroGLYCERIN (NITROSTAT) 0.4 MG SL tablet Place 1 tablet (0.4 mg total) under the tongue every 5 (five) minutes as needed. 25 tablet 3  . Omega-3 Fatty Acids (FISH OIL) 1200 MG CAPS Take 1 capsule by mouth daily.    . potassium chloride SA (K-DUR,KLOR-CON) 20 MEQ tablet Take 30 mEq by mouth 2 (two) times daily.     Marland Kitchen venlafaxine (EFFEXOR-XR) 75 MG 24 hr capsule Take 225 mg by mouth daily.       No current facility-administered medications for this visit.      Past Surgical History:  Procedure Laterality Date  . ABDOMINAL HYSTERECTOMY     abd?     Allergies  Allergen Reactions  . Penicillins     REACTION: ?rash  Family History  Problem Relation Age of Onset  . Diabetes Neg Hx   . Hypertension Neg Hx   . Coronary artery disease Neg Hx      Social History Ms. Lender reports that she has never smoked. She has never used smokeless tobacco. Ms. Miklos has no alcohol history on file.   Review of Systems CONSTITUTIONAL: No weight loss, fever, chills, weakness or fatigue.  HEENT: Eyes: No visual loss, blurred vision, double vision or yellow sclerae.No hearing loss, sneezing, congestion, runny nose or sore throat.  SKIN: No rash or itching.  CARDIOVASCULAR: per hpi RESPIRATORY: No shortness of breath, cough or sputum.  GASTROINTESTINAL: No anorexia, nausea, vomiting or diarrhea. No abdominal pain or blood.  GENITOURINARY: No burning on urination, no polyuria NEUROLOGICAL: No headache, dizziness, syncope, paralysis, ataxia, numbness or tingling in  the extremities. No change in bowel or bladder control.  MUSCULOSKELETAL: No muscle, back pain, joint pain or stiffness.  LYMPHATICS: No enlarged nodes. No history of splenectomy.  PSYCHIATRIC: No history of depression or anxiety.  ENDOCRINOLOGIC: No reports of sweating, cold or heat intolerance. No polyuria or polydipsia.  Marland Kitchen   Physical Examination Vitals:   04/30/18 1359  BP: 121/69  Pulse: 76  SpO2: 98%   Vitals:   04/30/18 1359  Weight: 144 lb 6.4 oz (65.5 kg)  Height: 4\' 11"  (1.499 m)    Gen: resting comfortably, no acute distress HEENT: no scleral icterus, pupils equal round and reactive, no palptable cervical adenopathy,  CV: RRR, no m/r/g, no jvd Resp: Clear to auscultation bilaterally GI: abdomen is soft, non-tender, non-distended, normal bowel sounds, no hepatosplenomegaly MSK: extremities are warm, no edema.  Skin: warm, no rash Neuro:  no focal deficits Psych: appropriate affect   Diagnostic Studies 11/2006 Cath FINDINGS:  1. LV: 133/13/35.  2. No aortic stenosis on pullback.  3. Left ventriculography: This was deferred due to markedly elevated  left ventricular end diastolic pressure.  4. Left main: There is no left main.  5. LAD: A fairly large vessel which wraps the apex of the heart. It  gives rise to a moderate sized diagonal. The proximal LAD is  heavily calcified. There is diffuse 30% stenosis. There is then a  focal 50% stenosis just after the origin of the diagonal.  6. Circumflex: Fairly large codominant vessel. There was a 99%  stenosis proximally and a 60% stenosis just after the marginal  which were stented using two overlapping bare metal stents. Flow  improved from TIMI II to TIMI III.  7. RCA: Relatively small, codominant vessel. There is a 30% stenosis  in the mid vessel just after the origin of acute marginal. The  acute marginal has a 90% ostial stenosis. It is a fairly small  vessel.  RECOMMENDATIONS: Successful  percutaneous revascularization of the  proximal circumflex. A small non flow limiting proximal edge dissection  required an additional overlapping stent. There was an excellent final  result. Her left ventricular end diastolic pressure is markedly  elevated. I, therefore, did not perform ventriculography. We will plan  on continuation of beta blocker in addition to ACE inhibitor. We will  continue aspirin indefinitely. Plavix should be continued for a minimum  of 30 days and preferably nine months given the acute presentation. She  will need echocardiogram to assess left ventricular systolic function.  12/2006 Cath HEMODYNAMIC RESULTS: Aorta 143/74 mmHg, left ventricle 140/28 mmHg.  ANGIOGRAPHIC FINDINGS:  1. There is a very short left main, essentially nearby separate ostia  of the left anterior descending and circumflex coronary arteries.  2. The left anterior descending is a medium to small caliber vessel  with distal tapering and 30% midvessel stenosis. There is  essentially one medium-sized branching diagonal. No flow-limiting  stenoses are noted.  3. The circumflex coronary artery is a medium to large caliber vessel  that is codominant with a small right coronary artery. Stent site  is visualized from the proximal portion of the vessel and was  widely patent. Otherwise there are minor luminal irregularities.  There is a ramus intermedius and a large multi-branched obtuse  marginal in the midvessel segment.  4. The right coronary artery is small and codominant. There is an  acute marginal visualized with approximately 80% ostial stenosis  which is not a new finding. Otherwise minor luminal irregularities  are noted.  5. Left ventriculography is performed in the RAO projection and  reveals an ejection fraction approximately 75% with no focal  anterior or inferior wall motion abnormality and trace mitral  regurgitation in the setting of ventricular  ectopy.  DIAGNOSIS:  1. Mild coronary atherosclerosis as discussed above with widely patent  stent site within the proximal circumflex.  2. Left ventricular ejection fraction approximately 75% with an  increased left ventricular end-diastolic pressure of 28 mmHg, trace  mitral regurgitation in the setting of ventricular ectopy, and no  significant pullback gradient.  DISCUSSION: I reviewed the results with the patient and with Dr. Dannielle Burn  by phone. At this point I would anticipate continued medical therapy.  Discharge will be planned for later today with follow-up in the [facility]  office with Dr. Dannielle Burn.    07/2013 Carotid US RICA 3-81%, LICA 01-75%, patent antegrade vertebrals  04/2014 echo Study Conclusions  - Left ventricle: The cavity size was normal. Wall thickness was increased in a pattern of mild LVH. Systolic function was normal. The estimated ejection fraction was in the range of 60% to 65%. There is evidence of diastolic dysfunction, indeterminant grade. Wall motion was normal; there were no regional wall motion abnormalities. - Aortic valve: Mildly calcified annulus. Mildly thickened leaflets. There is aortic sclerosis without stenosis. Valve area (VTI): 2.89 cm^2. Valve area (Vmax): 2.58 cm^2. - Mitral valve: There was mild regurgitation. - Technically adequate study.  Jan 2016 Carotid US RICA 1-02%, LICA 58-52%, >77% ECA bilateral, right subclavian stenosis  04/2016 Carotid US RICA 8-24%, LICA 23-53%      Assessment and Plan   1. CAD - no recent symptoms, continue current meds - EKG SR, no ischemic changes  2. PAD - no current symptoms - continue medical therapy.    3. HTN - at goal, continue current meds   4. Hyperlipidemia -continue statin, request labs from pcp  5. Carotid stenosis - repeat carotid US    Arnoldo Lenis, M.D

## 2018-04-30 NOTE — Patient Instructions (Signed)
Medication Instructions:  Your physician recommends that you continue on your current medications as directed. Please refer to the Current Medication list given to you today.  Labwork: NONE  Testing/Procedures: Your physician has requested that you have a carotid duplex. This test is an ultrasound of the carotid arteries in your neck. It looks at blood flow through these arteries that supply the brain with blood. Allow one hour for this exam. There are no restrictions or special instructions.  Follow-Up: Your physician wants you to follow-up in: [place] DR. BRANCH. You will receive a reminder letter in the mail two months in advance. If you don't receive a letter, please call our office to schedule the follow-up appointment.  Any Other Special Instructions Will Be Listed Below (If Applicable).  If you need a refill on your cardiac medications before your next appointment, please call your pharmacy.

## 2018-05-03 ENCOUNTER — Other Ambulatory Visit: Payer: Self-pay | Admitting: Cardiology

## 2018-05-05 ENCOUNTER — Encounter: Payer: Self-pay | Admitting: Cardiology

## 2018-05-08 ENCOUNTER — Other Ambulatory Visit: Payer: Self-pay | Admitting: Cardiology

## 2018-05-08 DIAGNOSIS — I6523 Occlusion and stenosis of bilateral carotid arteries: Secondary | ICD-10-CM

## 2018-05-21 ENCOUNTER — Ambulatory Visit (INDEPENDENT_AMBULATORY_CARE_PROVIDER_SITE_OTHER): Payer: Medicare Other

## 2018-05-21 DIAGNOSIS — I6523 Occlusion and stenosis of bilateral carotid arteries: Secondary | ICD-10-CM | POA: Diagnosis not present

## 2018-05-23 DIAGNOSIS — I10 Essential (primary) hypertension: Secondary | ICD-10-CM | POA: Diagnosis not present

## 2018-05-23 DIAGNOSIS — Z6829 Body mass index (BMI) 29.0-29.9, adult: Secondary | ICD-10-CM | POA: Diagnosis not present

## 2018-05-23 DIAGNOSIS — F411 Generalized anxiety disorder: Secondary | ICD-10-CM | POA: Diagnosis not present

## 2018-05-23 DIAGNOSIS — Z713 Dietary counseling and surveillance: Secondary | ICD-10-CM | POA: Diagnosis not present

## 2018-05-23 DIAGNOSIS — Z299 Encounter for prophylactic measures, unspecified: Secondary | ICD-10-CM | POA: Diagnosis not present

## 2018-05-30 ENCOUNTER — Telehealth: Payer: Self-pay | Admitting: *Deleted

## 2018-05-30 NOTE — Telephone Encounter (Signed)
-----   Message from Arnoldo Lenis, MD sent at 05/27/2018 12:55 PM EDT ----- Mild blockages on both sides from carotid US, we will continue to monitor   Zandra Abts MD

## 2018-05-30 NOTE — Telephone Encounter (Signed)
Pt aware - routed to pcp  

## 2018-09-05 DIAGNOSIS — Z1339 Encounter for screening examination for other mental health and behavioral disorders: Secondary | ICD-10-CM | POA: Diagnosis not present

## 2018-09-05 DIAGNOSIS — E7800 Pure hypercholesterolemia, unspecified: Secondary | ICD-10-CM | POA: Diagnosis not present

## 2018-09-05 DIAGNOSIS — Z299 Encounter for prophylactic measures, unspecified: Secondary | ICD-10-CM | POA: Diagnosis not present

## 2018-09-05 DIAGNOSIS — Z1211 Encounter for screening for malignant neoplasm of colon: Secondary | ICD-10-CM | POA: Diagnosis not present

## 2018-09-05 DIAGNOSIS — I10 Essential (primary) hypertension: Secondary | ICD-10-CM | POA: Diagnosis not present

## 2018-09-05 DIAGNOSIS — Z23 Encounter for immunization: Secondary | ICD-10-CM | POA: Diagnosis not present

## 2018-09-05 DIAGNOSIS — F411 Generalized anxiety disorder: Secondary | ICD-10-CM | POA: Diagnosis not present

## 2018-09-05 DIAGNOSIS — R5383 Other fatigue: Secondary | ICD-10-CM | POA: Diagnosis not present

## 2018-09-05 DIAGNOSIS — Z0000 Encounter for general adult medical examination without abnormal findings: Secondary | ICD-10-CM | POA: Diagnosis not present

## 2018-09-05 DIAGNOSIS — Z7189 Other specified counseling: Secondary | ICD-10-CM | POA: Diagnosis not present

## 2018-09-05 DIAGNOSIS — Z79899 Other long term (current) drug therapy: Secondary | ICD-10-CM | POA: Diagnosis not present

## 2018-09-05 DIAGNOSIS — Z1331 Encounter for screening for depression: Secondary | ICD-10-CM | POA: Diagnosis not present

## 2018-09-05 DIAGNOSIS — Z6829 Body mass index (BMI) 29.0-29.9, adult: Secondary | ICD-10-CM | POA: Diagnosis not present

## 2018-09-12 DIAGNOSIS — Z6828 Body mass index (BMI) 28.0-28.9, adult: Secondary | ICD-10-CM | POA: Diagnosis not present

## 2018-09-12 DIAGNOSIS — R7309 Other abnormal glucose: Secondary | ICD-10-CM | POA: Diagnosis not present

## 2018-09-12 DIAGNOSIS — Z299 Encounter for prophylactic measures, unspecified: Secondary | ICD-10-CM | POA: Diagnosis not present

## 2018-09-12 DIAGNOSIS — I10 Essential (primary) hypertension: Secondary | ICD-10-CM | POA: Diagnosis not present

## 2018-09-26 DIAGNOSIS — H353110 Nonexudative age-related macular degeneration, right eye, stage unspecified: Secondary | ICD-10-CM | POA: Diagnosis not present

## 2018-11-07 DIAGNOSIS — Z1231 Encounter for screening mammogram for malignant neoplasm of breast: Secondary | ICD-10-CM | POA: Diagnosis not present

## 2018-11-21 DIAGNOSIS — Z789 Other specified health status: Secondary | ICD-10-CM | POA: Diagnosis not present

## 2018-11-21 DIAGNOSIS — H1030 Unspecified acute conjunctivitis, unspecified eye: Secondary | ICD-10-CM | POA: Diagnosis not present

## 2018-11-21 DIAGNOSIS — Z6828 Body mass index (BMI) 28.0-28.9, adult: Secondary | ICD-10-CM | POA: Diagnosis not present

## 2018-11-21 DIAGNOSIS — Z299 Encounter for prophylactic measures, unspecified: Secondary | ICD-10-CM | POA: Diagnosis not present

## 2018-11-21 DIAGNOSIS — I2510 Atherosclerotic heart disease of native coronary artery without angina pectoris: Secondary | ICD-10-CM | POA: Diagnosis not present

## 2018-11-21 DIAGNOSIS — I10 Essential (primary) hypertension: Secondary | ICD-10-CM | POA: Diagnosis not present

## 2018-11-21 DIAGNOSIS — J320 Chronic maxillary sinusitis: Secondary | ICD-10-CM | POA: Diagnosis not present

## 2018-12-02 ENCOUNTER — Other Ambulatory Visit: Payer: Self-pay | Admitting: Cardiology

## 2018-12-06 DIAGNOSIS — Z6828 Body mass index (BMI) 28.0-28.9, adult: Secondary | ICD-10-CM | POA: Diagnosis not present

## 2018-12-06 DIAGNOSIS — I2510 Atherosclerotic heart disease of native coronary artery without angina pectoris: Secondary | ICD-10-CM | POA: Diagnosis not present

## 2018-12-06 DIAGNOSIS — I10 Essential (primary) hypertension: Secondary | ICD-10-CM | POA: Diagnosis not present

## 2018-12-06 DIAGNOSIS — Z299 Encounter for prophylactic measures, unspecified: Secondary | ICD-10-CM | POA: Diagnosis not present

## 2018-12-06 DIAGNOSIS — F411 Generalized anxiety disorder: Secondary | ICD-10-CM | POA: Diagnosis not present

## 2018-12-17 DIAGNOSIS — L570 Actinic keratosis: Secondary | ICD-10-CM | POA: Diagnosis not present

## 2018-12-17 DIAGNOSIS — D485 Neoplasm of uncertain behavior of skin: Secondary | ICD-10-CM | POA: Diagnosis not present

## 2018-12-17 DIAGNOSIS — D2370 Other benign neoplasm of skin of unspecified lower limb, including hip: Secondary | ICD-10-CM | POA: Diagnosis not present

## 2018-12-17 DIAGNOSIS — Z85828 Personal history of other malignant neoplasm of skin: Secondary | ICD-10-CM | POA: Diagnosis not present

## 2018-12-17 DIAGNOSIS — D0462 Carcinoma in situ of skin of left upper limb, including shoulder: Secondary | ICD-10-CM | POA: Diagnosis not present

## 2018-12-19 DIAGNOSIS — E1165 Type 2 diabetes mellitus with hyperglycemia: Secondary | ICD-10-CM | POA: Diagnosis not present

## 2018-12-19 DIAGNOSIS — Z6828 Body mass index (BMI) 28.0-28.9, adult: Secondary | ICD-10-CM | POA: Diagnosis not present

## 2018-12-19 DIAGNOSIS — Z299 Encounter for prophylactic measures, unspecified: Secondary | ICD-10-CM | POA: Diagnosis not present

## 2018-12-19 DIAGNOSIS — I10 Essential (primary) hypertension: Secondary | ICD-10-CM | POA: Diagnosis not present

## 2018-12-19 DIAGNOSIS — Z789 Other specified health status: Secondary | ICD-10-CM | POA: Diagnosis not present

## 2018-12-19 DIAGNOSIS — I2510 Atherosclerotic heart disease of native coronary artery without angina pectoris: Secondary | ICD-10-CM | POA: Diagnosis not present

## 2018-12-26 DIAGNOSIS — C44629 Squamous cell carcinoma of skin of left upper limb, including shoulder: Secondary | ICD-10-CM | POA: Diagnosis not present

## 2019-03-27 DIAGNOSIS — H353134 Nonexudative age-related macular degeneration, bilateral, advanced atrophic with subfoveal involvement: Secondary | ICD-10-CM | POA: Diagnosis not present

## 2019-03-28 DIAGNOSIS — I2510 Atherosclerotic heart disease of native coronary artery without angina pectoris: Secondary | ICD-10-CM | POA: Diagnosis not present

## 2019-03-28 DIAGNOSIS — Z299 Encounter for prophylactic measures, unspecified: Secondary | ICD-10-CM | POA: Diagnosis not present

## 2019-03-28 DIAGNOSIS — Z6829 Body mass index (BMI) 29.0-29.9, adult: Secondary | ICD-10-CM | POA: Diagnosis not present

## 2019-03-28 DIAGNOSIS — I10 Essential (primary) hypertension: Secondary | ICD-10-CM | POA: Diagnosis not present

## 2019-03-28 DIAGNOSIS — E1165 Type 2 diabetes mellitus with hyperglycemia: Secondary | ICD-10-CM | POA: Diagnosis not present

## 2019-04-11 DIAGNOSIS — Z299 Encounter for prophylactic measures, unspecified: Secondary | ICD-10-CM | POA: Diagnosis not present

## 2019-04-11 DIAGNOSIS — F411 Generalized anxiety disorder: Secondary | ICD-10-CM | POA: Diagnosis not present

## 2019-04-11 DIAGNOSIS — J029 Acute pharyngitis, unspecified: Secondary | ICD-10-CM | POA: Diagnosis not present

## 2019-04-11 DIAGNOSIS — I2510 Atherosclerotic heart disease of native coronary artery without angina pectoris: Secondary | ICD-10-CM | POA: Diagnosis not present

## 2019-04-11 DIAGNOSIS — Z6829 Body mass index (BMI) 29.0-29.9, adult: Secondary | ICD-10-CM | POA: Diagnosis not present

## 2019-04-11 DIAGNOSIS — I10 Essential (primary) hypertension: Secondary | ICD-10-CM | POA: Diagnosis not present

## 2019-05-01 ENCOUNTER — Ambulatory Visit: Payer: Medicare Other | Admitting: Cardiology

## 2019-05-07 ENCOUNTER — Encounter: Payer: Self-pay | Admitting: *Deleted

## 2019-05-08 ENCOUNTER — Encounter: Payer: Self-pay | Admitting: Physician Assistant

## 2019-05-08 ENCOUNTER — Other Ambulatory Visit: Payer: Self-pay | Admitting: *Deleted

## 2019-05-08 ENCOUNTER — Other Ambulatory Visit: Payer: Self-pay

## 2019-05-08 ENCOUNTER — Ambulatory Visit (INDEPENDENT_AMBULATORY_CARE_PROVIDER_SITE_OTHER): Payer: Medicare Other | Admitting: Physician Assistant

## 2019-05-08 VITALS — BP 129/69 | HR 70 | Temp 96.5°F | Ht 59.0 in | Wt 143.6 lb

## 2019-05-08 DIAGNOSIS — I2510 Atherosclerotic heart disease of native coronary artery without angina pectoris: Secondary | ICD-10-CM | POA: Diagnosis not present

## 2019-05-08 DIAGNOSIS — E782 Mixed hyperlipidemia: Secondary | ICD-10-CM

## 2019-05-08 DIAGNOSIS — I679 Cerebrovascular disease, unspecified: Secondary | ICD-10-CM

## 2019-05-08 DIAGNOSIS — I739 Peripheral vascular disease, unspecified: Secondary | ICD-10-CM

## 2019-05-08 DIAGNOSIS — I10 Essential (primary) hypertension: Secondary | ICD-10-CM | POA: Diagnosis not present

## 2019-05-08 MED ORDER — ISOSORBIDE MONONITRATE ER 30 MG PO TB24: 30.0000 mg | ORAL_TABLET | Freq: Every day | ORAL | 2 refills | Status: DC

## 2019-05-08 MED ORDER — HYDROCHLOROTHIAZIDE 25 MG PO TABS: 25.0000 mg | ORAL_TABLET | Freq: Every day | ORAL | 2 refills | Status: DC

## 2019-05-08 MED ORDER — AMLODIPINE BESYLATE 10 MG PO TABS: 10.0000 mg | ORAL_TABLET | Freq: Every day | ORAL | 2 refills | Status: AC

## 2019-05-08 MED ORDER — POTASSIUM CHLORIDE CRYS ER 20 MEQ PO TBCR: 30.0000 meq | EXTENDED_RELEASE_TABLET | Freq: Two times a day (BID) | ORAL | 2 refills | Status: DC

## 2019-05-08 MED ORDER — ATORVASTATIN CALCIUM 80 MG PO TABS: 80.0000 mg | ORAL_TABLET | Freq: Every day | ORAL | 2 refills | Status: DC

## 2019-05-08 NOTE — Progress Notes (Signed)
Cardiology Office Note   Date:  05/08/2019   ID:  [name], [name] May 21, 1951, MRN [id number]  PCP:  Glenda Chroman, MD Cardiologist:  Carlyle Dolly, MD 04/30/2018 [name], PA-C    History of Present Illness: [name] is a 68 y.o. female with a history of NSTEMI 2008 s/p BMS CFX, nl EF, PAD w/ mild L-RAS & severe SMA dz, mild-mod carotid dz 2016, HTN, HLD, anxiety/depression.  [name] presents for cardiology follow up.  She is active around the house. Is not exercising as much ([employer] is closed), but is getting outside more.  No chest pain w/ exertion.  No LE edema, no orthopnea or PND.  Drinks plenty of water, used to be Cokes, back when she smoked.  No abd pain w/ meals or at other times.   Occasional sinus headaches, uses steroid nasal spray seasonally. No vision problems, unilateral weakness.    Past Medical History:  Diagnosis Date  . Anxiety and depression   . CAD (coronary artery disease)    NSTEMI/BMS pCFX, 01/08  . Colitis   . CVD (cerebrovascular disease)    non obstructive  . Dyslipidemia   . HTN (hypertension)   . PAD (peripheral artery disease) (HCC)    mild left renal artery stenosis; severe osital SMA stenosis    Past Surgical History:  Procedure Laterality Date  . ABDOMINAL HYSTERECTOMY    . CORONARY ANGIOPLASTY WITH STENT PLACEMENT  2008   BMS x 2 CFX    Current Outpatient Medications  Medication Sig Dispense Refill  . ALPRAZolam (XANAX) 0.25 MG tablet Take 0.25 mg by mouth 2 (two) times daily as needed.      Marland Kitchen amLODipine (NORVASC) 10 MG tablet TAKE 1 TABLET ONCE DAILY 30 tablet 6  . aspirin 81 MG EC tablet Take 81 mg by mouth daily.      Marland Kitchen atorvastatin (LIPITOR) 80 MG tablet TAKE ONE TABLET BY MOUTH DAILY 90 tablet 3  . carvedilol (COREG) 12.5 MG tablet TAKE ONE TABLET BY MOUTH TWICE DAILY 180 tablet 1  . Cholecalciferol (VITAMIN D3) 2000 UNITS capsule Take 2,000 Units by mouth daily.    Marland Kitchen  CINNAMON PO Take 1,000 mg by mouth 2 (two) times daily.    . fluticasone (FLONASE) 50 MCG/ACT nasal spray Place 1 spray into both nostrils daily as needed for allergies or rhinitis.     . hydrochlorothiazide (HYDRODIURIL) 25 MG tablet TAKE 1 TABLET ONCE DAILY 30 tablet 6  . isosorbide mononitrate (IMDUR) 30 MG 24 hr tablet TAKE ONE TABLET ONCE DAILY 30 tablet 6  . magnesium oxide (MAG-OX) 400 MG tablet Take 400 mg by mouth 2 (two) times daily.    . Multiple Vitamins-Minerals (ONE-A-DAY EXTRAS ANTIOXIDANT) CAPS Take 1 capsule by mouth daily.      . Multiple Vitamins-Minerals (PRESERVISION/LUTEIN) CAPS Take 1 capsule by mouth 2 (two) times daily.      . nitroGLYCERIN (NITROSTAT) 0.4 MG SL tablet Place 1 tablet (0.4 mg total) under the tongue every 5 (five) minutes as needed. 25 tablet 3  . Omega-3 Fatty Acids (FISH OIL) 1200 MG CAPS Take 1 capsule by mouth 2 (two) times a day.     . potassium chloride SA (K-DUR,KLOR-CON) 20 MEQ tablet Take 30 mEq by mouth 2 (two) times daily.     Marland Kitchen venlafaxine (EFFEXOR-XR) 75 MG 24 hr capsule Take 225 mg by mouth daily.       No current facility-administered medications for this visit.  Allergies:   Penicillins    Social History:  The patient  reports that she has never smoked. She has never used smokeless tobacco.   Family History:  The patient's family history is not on file.  has no family status information on file.    ROS:  Please see the history of present illness. All other systems are reviewed and negative.    PHYSICAL EXAM: VS:  BP 129/69   Pulse 70   Temp (!) 96.5 F (35.8 C)   Ht 4\' 11"  (1.499 m)   Wt 143 lb 9.6 oz (65.1 kg)   SpO2 94%   BMI 29.00 kg/m  , BMI Body mass index is 29 kg/m. GEN: Well nourished, well developed, female in no acute distress HEENT: normal for age  Neck: no JVD, no carotid bruit, no masses Cardiac: RRR; no murmur, no rubs, or gallops Respiratory:  clear to auscultation bilaterally, normal work of  breathing GI: soft, nontender, nondistended, + BS MS: no deformity or atrophy; no edema; distal pulses are 2+ in all 4 extremities  Skin: warm and dry, no rash Neuro:  Strength and sensation are intact Psych: euthymic mood, full affect   EKG:  EKG is ordered today. The ekg ordered today demonstrates sinus rhythm, heart rate 74, no acute ischemic changes, no significant change from 04/30/2018  ECHO: 04/22/2014 - Left ventricle: The cavity size was normal. Wall thickness was  increased in a pattern of mild LVH. Systolic function was normal.  The estimated ejection fraction was in the range of 60% to 65%.  There is evidence of diastolic dysfunction, indeterminant grade.  Wall motion was normal; there were no regional wall motion  abnormalities.  - Aortic valve: Mildly calcified annulus. Mildly thickened  leaflets. There is aortic sclerosis without stenosis. Valve area  (VTI): 2.89 cm^2. Valve area (Vmax): 2.58 cm^2.  - Mitral valve: There was mild regurgitation.  - Technically adequate study.   CATH: 11/23/2006 FINDINGS:  1. LV:  133/13/35.  2. No aortic stenosis on pullback.  3. Left ventriculography:  This was deferred due to markedly elevated      left ventricular end diastolic pressure.  4. Left main:  There is no left main.  5. LAD:  A fairly large vessel which wraps the apex of the heart.  It      gives rise to a moderate sized diagonal.  The proximal LAD is      heavily calcified.  There is diffuse 30% stenosis.  There is then a      focal 50% stenosis just after the origin of the diagonal.  6. Circumflex:  Fairly large codominant vessel.  There was a 99%      stenosis proximally and a 60% stenosis just after the marginal      which were stented using two overlapping bare metal stents.  Flow      improved from TIMI II to TIMI III.  7. RCA:  Relatively small, codominant vessel.  There is a 30% stenosis      in the mid vessel just after the origin of acute  marginal.  The      acute marginal has a 90% ostial stenosis.  It is a fairly small      vessel.   RECOMMENDATIONS:  Successful percutaneous revascularization of the  proximal circumflex.  A small non flow limiting proximal edge dissection  required an additional overlapping stent.  There was an excellent final  result.  Her left ventricular end  diastolic pressure is markedly  elevated.  I, therefore, did not perform ventriculography.  We will plan  on continuation of beta blocker in addition to ACE inhibitor.  We will  continue aspirin indefinitely.  Plavix should be continued for a minimum  of 30 days and preferably nine months given the acute presentation.  She  will need echocardiogram to assess left ventricular systolic function.  MYOVIEW: 06/2011 Normal perfusion, EF 62%  CAROTID DOPPLERS: 05/21/2018 Final Interpretation: Right Carotid: Velocities in the right ICA are consistent with a 1-39% stenosis.                The ECA appears >50% stenosed. Stable from prior exam.  Left Carotid: Velocities in the left ICA are consistent with a 1-39% stenosis.               The ECA appears >50% stenosed. Stable from prior exam.  Vertebrals:  Bilateral vertebral arteries demonstrate antegrade flow. Subclavians: Right subclavian artery was stenotic. Normal flow hemodynamics were              seen in the left subclavian artery. Stable from prior exam.   Recent Labs: No results found for requested labs within last 8760 hours.  CBC No results found for: WBC, RBC, HGB, HCT, PLT, MCV, MCH, MCHC, RDW, LYMPHSABS, MONOABS, EOSABS, BASOSABS CMP Latest Ref Rng & Units 01/29/2014  Total Protein 6.0 - 8.3 g/dL 7.5  Total Bilirubin 0.3 - 1.2 mg/dL 0.7  Alkaline Phos 39 - 117 U/L 63  AST 0 - 37 U/L 40(H)  ALT 0 - 35 U/L 53(H)   BMET  Sodium  141  09/05/2018  Potassium  3.2 (K. Dur initiated)   BUN  15   Creatinine  1.72   Glucose  151       Lipid Panel Lab Results  Component Value  Date   CHOL  127   09/05/2018   HDL  44      LDLCALC  48      TRIG  176      CHOLHDL          Wt Readings from Last 3 Encounters:  05/08/19 143 lb 9.6 oz (65.1 kg)  04/30/18 144 lb 6.4 oz (65.5 kg)  04/30/17 148 lb (67.1 kg)     Other studies Reviewed: Additional studies/ records that were reviewed today include: Office notes, hospital records and testing.  ASSESSMENT AND PLAN:  1.  CAD: -She is having no ongoing ischemic symptoms. -No testing is indicated at this time. - She is to continue aspirin, high-dose statin, beta-blocker, Imdur and as needed nitrates. - She is encouraged to increase her activity level as tolerated. -Let us know if she develops any symptoms.  2.  Cerebrovascular disease: - Carotid Dopplers reviewed -She is having no symptoms from carotid disease - Dr. Harl Bowie to decide the time interval for repeat testing  3.  PAD: - She had nonobstructive renal artery stenosis and moderate SMA disease in 2016 - She is having no GI symptoms and her renal function is normal and stable. - Further testing per Dr. Harl Bowie  4.  Dyslipidemia: - She is compliant with the statin - Labs are followed by her PCP  5.  Hypertension: -Blood pressure is well controlled on current medications, no changes   Current medicines are reviewed at length with the patient today.  The patient does not have concerns regarding medicines.  The following changes have been made:  no change  Labs/ tests  ordered today include:   Orders Placed This Encounter  Procedures  . EKG 12-Lead     Disposition:   FU with Carlyle Dolly, MD in 1 year  Signed, [name], PA-C  05/08/2019 1:57 PM    [place] Phone: [phone]; Fax: [phone]

## 2019-05-08 NOTE — Patient Instructions (Addendum)

## 2019-05-31 ENCOUNTER — Other Ambulatory Visit: Payer: Self-pay | Admitting: Cardiology

## 2019-06-24 DIAGNOSIS — D2370 Other benign neoplasm of skin of unspecified lower limb, including hip: Secondary | ICD-10-CM | POA: Diagnosis not present

## 2019-06-24 DIAGNOSIS — L570 Actinic keratosis: Secondary | ICD-10-CM | POA: Diagnosis not present

## 2019-06-24 DIAGNOSIS — Z85828 Personal history of other malignant neoplasm of skin: Secondary | ICD-10-CM | POA: Diagnosis not present

## 2019-07-07 DIAGNOSIS — Z299 Encounter for prophylactic measures, unspecified: Secondary | ICD-10-CM | POA: Diagnosis not present

## 2019-07-07 DIAGNOSIS — E1165 Type 2 diabetes mellitus with hyperglycemia: Secondary | ICD-10-CM | POA: Diagnosis not present

## 2019-07-07 DIAGNOSIS — E7800 Pure hypercholesterolemia, unspecified: Secondary | ICD-10-CM | POA: Diagnosis not present

## 2019-07-07 DIAGNOSIS — I2510 Atherosclerotic heart disease of native coronary artery without angina pectoris: Secondary | ICD-10-CM | POA: Diagnosis not present

## 2019-07-07 DIAGNOSIS — Z6829 Body mass index (BMI) 29.0-29.9, adult: Secondary | ICD-10-CM | POA: Diagnosis not present

## 2019-07-07 DIAGNOSIS — I10 Essential (primary) hypertension: Secondary | ICD-10-CM | POA: Diagnosis not present

## 2019-08-28 DIAGNOSIS — Z23 Encounter for immunization: Secondary | ICD-10-CM | POA: Diagnosis not present

## 2019-09-11 DIAGNOSIS — Z1339 Encounter for screening examination for other mental health and behavioral disorders: Secondary | ICD-10-CM | POA: Diagnosis not present

## 2019-09-11 DIAGNOSIS — Z0000 Encounter for general adult medical examination without abnormal findings: Secondary | ICD-10-CM | POA: Diagnosis not present

## 2019-09-11 DIAGNOSIS — Z7189 Other specified counseling: Secondary | ICD-10-CM | POA: Diagnosis not present

## 2019-09-11 DIAGNOSIS — Z299 Encounter for prophylactic measures, unspecified: Secondary | ICD-10-CM | POA: Diagnosis not present

## 2019-09-11 DIAGNOSIS — I10 Essential (primary) hypertension: Secondary | ICD-10-CM | POA: Diagnosis not present

## 2019-09-11 DIAGNOSIS — Z79899 Other long term (current) drug therapy: Secondary | ICD-10-CM | POA: Diagnosis not present

## 2019-09-11 DIAGNOSIS — Z1331 Encounter for screening for depression: Secondary | ICD-10-CM | POA: Diagnosis not present

## 2019-09-11 DIAGNOSIS — E7800 Pure hypercholesterolemia, unspecified: Secondary | ICD-10-CM | POA: Diagnosis not present

## 2019-09-11 DIAGNOSIS — Z1211 Encounter for screening for malignant neoplasm of colon: Secondary | ICD-10-CM | POA: Diagnosis not present

## 2019-09-11 DIAGNOSIS — R5383 Other fatigue: Secondary | ICD-10-CM | POA: Diagnosis not present

## 2019-09-11 DIAGNOSIS — F411 Generalized anxiety disorder: Secondary | ICD-10-CM | POA: Diagnosis not present

## 2019-09-29 DIAGNOSIS — H353133 Nonexudative age-related macular degeneration, bilateral, advanced atrophic without subfoveal involvement: Secondary | ICD-10-CM | POA: Diagnosis not present

## 2019-10-10 ENCOUNTER — Other Ambulatory Visit: Payer: Self-pay | Admitting: Physician Assistant

## 2019-10-27 DIAGNOSIS — I10 Essential (primary) hypertension: Secondary | ICD-10-CM | POA: Diagnosis not present

## 2019-10-27 DIAGNOSIS — E1165 Type 2 diabetes mellitus with hyperglycemia: Secondary | ICD-10-CM | POA: Diagnosis not present

## 2019-10-27 DIAGNOSIS — Z6828 Body mass index (BMI) 28.0-28.9, adult: Secondary | ICD-10-CM | POA: Diagnosis not present

## 2019-10-27 DIAGNOSIS — I2510 Atherosclerotic heart disease of native coronary artery without angina pectoris: Secondary | ICD-10-CM | POA: Diagnosis not present

## 2019-10-27 DIAGNOSIS — Z299 Encounter for prophylactic measures, unspecified: Secondary | ICD-10-CM | POA: Diagnosis not present

## 2019-12-09 DIAGNOSIS — Z1231 Encounter for screening mammogram for malignant neoplasm of breast: Secondary | ICD-10-CM | POA: Diagnosis not present

## 2019-12-16 DIAGNOSIS — L570 Actinic keratosis: Secondary | ICD-10-CM | POA: Diagnosis not present

## 2019-12-16 DIAGNOSIS — L821 Other seborrheic keratosis: Secondary | ICD-10-CM | POA: Diagnosis not present

## 2019-12-16 DIAGNOSIS — D1801 Hemangioma of skin and subcutaneous tissue: Secondary | ICD-10-CM | POA: Diagnosis not present

## 2019-12-16 DIAGNOSIS — Z85828 Personal history of other malignant neoplasm of skin: Secondary | ICD-10-CM | POA: Diagnosis not present

## 2019-12-19 DIAGNOSIS — E2839 Other primary ovarian failure: Secondary | ICD-10-CM | POA: Diagnosis not present

## 2020-01-14 ENCOUNTER — Other Ambulatory Visit: Payer: Self-pay | Admitting: Physician Assistant

## 2020-01-21 DIAGNOSIS — E7800 Pure hypercholesterolemia, unspecified: Secondary | ICD-10-CM | POA: Diagnosis not present

## 2020-01-21 DIAGNOSIS — F411 Generalized anxiety disorder: Secondary | ICD-10-CM | POA: Diagnosis not present

## 2020-01-21 DIAGNOSIS — I10 Essential (primary) hypertension: Secondary | ICD-10-CM | POA: Diagnosis not present

## 2020-01-21 DIAGNOSIS — Z299 Encounter for prophylactic measures, unspecified: Secondary | ICD-10-CM | POA: Diagnosis not present

## 2020-01-21 DIAGNOSIS — E782 Mixed hyperlipidemia: Secondary | ICD-10-CM | POA: Diagnosis not present

## 2020-01-21 DIAGNOSIS — E1165 Type 2 diabetes mellitus with hyperglycemia: Secondary | ICD-10-CM | POA: Diagnosis not present

## 2020-01-22 ENCOUNTER — Other Ambulatory Visit: Payer: Self-pay | Admitting: Physician Assistant

## 2020-02-02 DIAGNOSIS — I10 Essential (primary) hypertension: Secondary | ICD-10-CM | POA: Diagnosis not present

## 2020-02-02 DIAGNOSIS — Z299 Encounter for prophylactic measures, unspecified: Secondary | ICD-10-CM | POA: Diagnosis not present

## 2020-02-02 DIAGNOSIS — E1165 Type 2 diabetes mellitus with hyperglycemia: Secondary | ICD-10-CM | POA: Diagnosis not present

## 2020-02-11 ENCOUNTER — Other Ambulatory Visit: Payer: Self-pay | Admitting: Physician Assistant

## 2020-04-01 DIAGNOSIS — H353133 Nonexudative age-related macular degeneration, bilateral, advanced atrophic without subfoveal involvement: Secondary | ICD-10-CM | POA: Diagnosis not present

## 2020-05-12 DIAGNOSIS — E785 Hyperlipidemia, unspecified: Secondary | ICD-10-CM | POA: Diagnosis not present

## 2020-05-12 DIAGNOSIS — I10 Essential (primary) hypertension: Secondary | ICD-10-CM | POA: Diagnosis not present

## 2020-05-25 ENCOUNTER — Ambulatory Visit: Payer: Medicare Other | Admitting: Cardiology

## 2020-06-07 ENCOUNTER — Other Ambulatory Visit: Payer: Self-pay | Admitting: Cardiology

## 2020-06-07 DIAGNOSIS — F419 Anxiety disorder, unspecified: Secondary | ICD-10-CM | POA: Diagnosis not present

## 2020-06-07 DIAGNOSIS — Z299 Encounter for prophylactic measures, unspecified: Secondary | ICD-10-CM | POA: Diagnosis not present

## 2020-06-07 DIAGNOSIS — J309 Allergic rhinitis, unspecified: Secondary | ICD-10-CM | POA: Diagnosis not present

## 2020-06-07 DIAGNOSIS — I10 Essential (primary) hypertension: Secondary | ICD-10-CM | POA: Diagnosis not present

## 2020-06-14 DIAGNOSIS — L814 Other melanin hyperpigmentation: Secondary | ICD-10-CM | POA: Diagnosis not present

## 2020-06-14 DIAGNOSIS — L821 Other seborrheic keratosis: Secondary | ICD-10-CM | POA: Diagnosis not present

## 2020-06-14 DIAGNOSIS — L570 Actinic keratosis: Secondary | ICD-10-CM | POA: Diagnosis not present

## 2020-06-14 DIAGNOSIS — Z85828 Personal history of other malignant neoplasm of skin: Secondary | ICD-10-CM | POA: Diagnosis not present

## 2020-06-29 ENCOUNTER — Ambulatory Visit: Payer: Medicare Other | Admitting: Cardiology

## 2020-07-13 DIAGNOSIS — I10 Essential (primary) hypertension: Secondary | ICD-10-CM | POA: Diagnosis not present

## 2020-07-13 DIAGNOSIS — E785 Hyperlipidemia, unspecified: Secondary | ICD-10-CM | POA: Diagnosis not present

## 2020-07-16 ENCOUNTER — Other Ambulatory Visit: Payer: Self-pay | Admitting: Physician Assistant

## 2020-07-16 NOTE — Telephone Encounter (Signed)
This is a [facility] pt °

## 2020-07-20 ENCOUNTER — Other Ambulatory Visit: Payer: Self-pay | Admitting: Physician Assistant

## 2020-07-20 NOTE — Telephone Encounter (Signed)
This is a [facility] pt, Dr. Branch 

## 2020-07-27 ENCOUNTER — Other Ambulatory Visit: Payer: Self-pay | Admitting: Physician Assistant

## 2020-07-27 NOTE — Telephone Encounter (Signed)
This is a Nurse, mental health pt, Dr. Harl Bowie

## 2020-08-12 DIAGNOSIS — E785 Hyperlipidemia, unspecified: Secondary | ICD-10-CM | POA: Diagnosis not present

## 2020-08-12 DIAGNOSIS — I10 Essential (primary) hypertension: Secondary | ICD-10-CM | POA: Diagnosis not present

## 2020-08-13 ENCOUNTER — Other Ambulatory Visit: Payer: Self-pay | Admitting: Cardiology

## 2020-08-17 DIAGNOSIS — Z23 Encounter for immunization: Secondary | ICD-10-CM | POA: Diagnosis not present

## 2020-08-19 NOTE — Progress Notes (Signed)
Cardiology Office Note  Date: 08/20/2020   ID: [name], DOB 03-Sep-1951, MRN [id number]  PCP:  Glenda Chroman, MD  Cardiologist:  Carlyle Dolly, MD Electrophysiologist:  None   Chief Complaint: Follow-up CAD  History of Present Illness: [name] is a 69 y.o. female with a history of CAD (NSTEMI 2009 status post BMS to LCx).  PAD with mild renal artery stenosis and severe superior mesenteric artery disease.  Mild to moderate carotid disease 2016, HTN, HLD, anxiety/depression  Last seen by [name], PA-C 05/08/2019.  She was being active around the home.  No exertional chest pain, LE edema, orthopnea, or PND.  She was continuing aspirin, high-dose statin, beta-blocker, Imdur, and sublingual nitroglycerin as needed.  Carotid Dopplers were reviewed.  She was having no symptoms from carotid artery disease.  PAD: Nonobstructive RAS and moderate SMA disease 2016.  She was compliant with statin.  Blood pressure was well controlled on current medications, there were no changes.   Patient is here for follow-up having last been seen in June 2020.  She denies any recent anginal or exertional symptoms, palpitations or arrhythmias, CVA or TIA-like symptoms, orthopnea, PND, bleeding, claudication-like symptoms, DVT or PE-like symptoms, lower extremity edema.  She is compliant with all of her medications.   Past Medical History:  Diagnosis Date  . Anxiety and depression   . CAD (coronary artery disease)    NSTEMI/BMS pCFX, 01/08  . Colitis   . CVD (cerebrovascular disease)    non obstructive  . Dyslipidemia   . HTN (hypertension)   . PAD (peripheral artery disease) (HCC)    mild left renal artery stenosis; severe osital SMA stenosis    Past Surgical History:  Procedure Laterality Date  . ABDOMINAL HYSTERECTOMY    . CORONARY ANGIOPLASTY WITH STENT PLACEMENT  2008   BMS x 2 CFX    Current Outpatient Medications  Medication Sig Dispense Refill  . ALPRAZolam  (XANAX) 0.25 MG tablet Take 0.25 mg by mouth 2 (two) times daily as needed.      Marland Kitchen amLODipine (NORVASC) 10 MG tablet Take 1 tablet (10 mg total) by mouth daily. 90 tablet 2  . aspirin 81 MG EC tablet Take 81 mg by mouth daily.      Marland Kitchen atorvastatin (LIPITOR) 80 MG tablet TAKE ONE TABLET BY MOUTH DAILY. 90 tablet 1  . carvedilol (COREG) 12.5 MG tablet TAKE ONE TABLET BY MOUTH TWICE DAILY 180 tablet 0  . Cholecalciferol (VITAMIN D3) 2000 UNITS capsule Take 2,000 Units by mouth daily.    Marland Kitchen CINNAMON PO Take 1,000 mg by mouth 2 (two) times daily.    . fluticasone (FLONASE) 50 MCG/ACT nasal spray Place 1 spray into both nostrils daily as needed for allergies or rhinitis.     . hydrochlorothiazide (HYDRODIURIL) 25 MG tablet TAKE ONE TABLET BY MOUTH DAILY. 15 tablet 0  . isosorbide mononitrate (IMDUR) 30 MG 24 hr tablet TAKE ONE TABLET BY MOUTH DAILY. 30 tablet 0  . magnesium oxide (MAG-OX) 400 MG tablet Take 400 mg by mouth 2 (two) times daily.    . Multiple Vitamins-Minerals (ONE-A-DAY EXTRAS ANTIOXIDANT) CAPS Take 1 capsule by mouth daily.      . Multiple Vitamins-Minerals (PRESERVISION/LUTEIN) CAPS Take 1 capsule by mouth 2 (two) times daily.      . nitroGLYCERIN (NITROSTAT) 0.4 MG SL tablet Place 1 tablet (0.4 mg total) under the tongue every 5 (five) minutes as needed. 25 tablet 3  . Omega-3 Fatty  Acids (FISH OIL) 1200 MG CAPS Take 1 capsule by mouth 2 (two) times a day.     . potassium chloride SA (KLOR-CON) 20 MEQ tablet TAKE 1 AND 1/2 TABLETS BY MOUTH TWICE DAILY. 270 tablet 1  . venlafaxine (EFFEXOR-XR) 75 MG 24 hr capsule Take 225 mg by mouth daily.       No current facility-administered medications for this visit.   Allergies:  Penicillins   Social History: The patient  reports that she has never smoked. She has never used smokeless tobacco.   Family History: The patient's family history includes Asthma in her maternal grandmother; COPD in her father; Cirrhosis in her mother.   ROS:   Please see the history of present illness. Otherwise, complete review of systems is positive for none.  All other systems are reviewed and negative.   Physical Exam: VS:  BP (!) 110/50   Pulse 72   Ht 4\' 11"  (1.499 m)   Wt 144 lb (65.3 kg)   SpO2 97%   BMI 29.08 kg/m , BMI Body mass index is 29.08 kg/m.  Wt Readings from Last 3 Encounters:  08/20/20 144 lb (65.3 kg)  05/08/19 143 lb 9.6 oz (65.1 kg)  04/30/18 144 lb 6.4 oz (65.5 kg)    General: Patient appears comfortable at rest. Neck: Supple, no elevated JVP or carotid bruits, no thyromegaly. Lungs: Clear to auscultation, nonlabored breathing at rest. Cardiac: Regular rate and rhythm, no S3 or significant systolic murmur, no pericardial rub. Extremities: No pitting edema, distal pulses 2+. Skin: Warm and dry. Musculoskeletal: No kyphosis. Neuropsychiatric: Alert and oriented x3, affect grossly appropriate.  ECG:  An ECG dated 08/20/2020 was personally reviewed today and demonstrated:  Normal sinus rhythm rate of 72.  Recent Labwork: No results found for requested labs within last 8760 hours.     Component Value Date/Time   CHOL 170 01/29/2014 1049   TRIG 203 (H) 01/29/2014 1049   HDL 48 01/29/2014 1049   CHOLHDL 3.5 01/29/2014 1049   VLDL 41 (H) 01/29/2014 1049   LDLCALC 81 01/29/2014 1049    Other Studies Reviewed Today:  ECHO: 04/22/2014 - Left ventricle: The cavity size was normal. Wall thickness was  increased in a pattern of mild LVH. Systolic function was normal.  The estimated ejection fraction was in the range of 60% to 65%.  There is evidence of diastolic dysfunction, indeterminant grade.  Wall motion was normal; there were no regional wall motion  abnormalities.  - Aortic valve: Mildly calcified annulus. Mildly thickened  leaflets. There is aortic sclerosis without stenosis. Valve area  (VTI): 2.89 cm^2. Valve area (Vmax): 2.58 cm^2.  - Mitral valve: There was mild regurgitation.  -  Technically adequate study.   CATH: 11/23/2006 FINDINGS: 1. LV: 133/13/35. 2. No aortic stenosis on pullback. 3. Left ventriculography: This was deferred due to markedly elevated left ventricular end diastolic pressure. 4. Left main: There is no left main. 5. LAD: A fairly large vessel which wraps the apex of the heart. It gives rise to a moderate sized diagonal. The proximal LAD is heavily calcified. There is diffuse 30% stenosis. There is then a focal 50% stenosis just after the origin of the diagonal. 6. Circumflex: Fairly large codominant vessel. There was a 99% stenosis proximally and a 60% stenosis just after the marginal which were stented using two overlapping bare metal stents. Flow improved from TIMI II to TIMI III. 7. RCA: Relatively small, codominant vessel. There is a 30% stenosis in the  mid vessel just after the origin of acute marginal. The acute marginal has a 90% ostial stenosis. It is a fairly small vessel.  RECOMMENDATIONS: Successful percutaneous revascularization of the proximal circumflex. A small non flow limiting proximal edge dissection required an additional overlapping stent. There was an excellent final result. Her left ventricular end diastolic pressure is markedly elevated. I, therefore, did not perform ventriculography. We will plan on continuation of beta blocker in addition to ACE inhibitor. We will continue aspirin indefinitely. Plavix should be continued for a minimum of 30 days and preferably nine months given the acute presentation. She will need echocardiogram to assess left ventricular systolic function.  MYOVIEW: 06/2011 Normal perfusion, EF 62%  CAROTID DOPPLERS: 05/21/2018 Final Interpretation: Right Carotid: Velocities in the right ICA are consistent with a 1-39% stenosis. The ECA appears >50% stenosed. Stable from prior  exam.  Left Carotid: Velocities in the left ICA are consistent with a 1-39% stenosis. The ECA appears >50% stenosed. Stable from prior exam.  Vertebrals: Bilateral vertebral arteries demonstrate antegrade flow. Subclavians: Right subclavian artery was stenotic. Normal flow hemodynamics were seen in the left subclavian artery. Stable from prior exam.    Assessment and Plan:  1. CAD in native artery   2. Bilateral carotid artery stenosis   3. PAD (peripheral artery disease) ([place])   4. Mixed hyperlipidemia   5. Essential hypertension, benign    1. CAD in native artery Denies any anginal or exertional symptoms.  Continue aspirin 81 mg daily.  Carvedilol 12.5 mg p.o. twice daily, Imdur 30 mg p.o. daily.  Nitroglycerin sublingual as needed.  2. Bilateral carotid artery stenosis History of mild carotid artery stenosis.  Denies any neurologic symptoms.  Mild carotid bruit heard on right.  3. PAD (peripheral artery disease)  Mild renal artery stenosis and severe superior mesenteric artery disease. Denies any claudication-like symptoms.  4. Mixed hyperlipidemia Continue Lipitor 80 mg p.o. daily.  No recent PCP labs.  Will attempt to obtain from PCP  5. Essential hypertension, benign Blood pressure well controlled today.  BP 110/50.  Continue amlodipine 10 mg daily, carvedilol 12.5 mg p.o. twice daily.  HCTZ 25 mg daily.  Medication Adjustments/Labs and Tests Ordered: Current medicines are reviewed at length with the patient today.  Concerns regarding medicines are outlined above.   Disposition: Follow-up with Dr. Harl Bowie or APP 1 year  Signed, Levell July, NP 08/20/2020 1:23 PM    [facility] [place], [place], [place] [address] Phone: [phone]; Fax: [phone]

## 2020-08-20 ENCOUNTER — Encounter: Payer: Self-pay | Admitting: *Deleted

## 2020-08-20 ENCOUNTER — Other Ambulatory Visit: Payer: Self-pay

## 2020-08-20 ENCOUNTER — Encounter: Payer: Self-pay | Admitting: Family Medicine

## 2020-08-20 ENCOUNTER — Ambulatory Visit (INDEPENDENT_AMBULATORY_CARE_PROVIDER_SITE_OTHER): Payer: Medicare Other | Admitting: Family Medicine

## 2020-08-20 VITALS — BP 110/50 | HR 72 | Ht 59.0 in | Wt 144.0 lb

## 2020-08-20 DIAGNOSIS — I6523 Occlusion and stenosis of bilateral carotid arteries: Secondary | ICD-10-CM | POA: Diagnosis not present

## 2020-08-20 DIAGNOSIS — I10 Essential (primary) hypertension: Secondary | ICD-10-CM | POA: Diagnosis not present

## 2020-08-20 DIAGNOSIS — E782 Mixed hyperlipidemia: Secondary | ICD-10-CM | POA: Diagnosis not present

## 2020-08-20 DIAGNOSIS — I739 Peripheral vascular disease, unspecified: Secondary | ICD-10-CM

## 2020-08-20 DIAGNOSIS — I2510 Atherosclerotic heart disease of native coronary artery without angina pectoris: Secondary | ICD-10-CM | POA: Diagnosis not present

## 2020-08-20 NOTE — Patient Instructions (Signed)

## 2020-09-06 ENCOUNTER — Other Ambulatory Visit: Payer: Self-pay | Admitting: Cardiology

## 2020-09-11 ENCOUNTER — Other Ambulatory Visit: Payer: Self-pay | Admitting: Cardiology

## 2020-09-13 DIAGNOSIS — E7800 Pure hypercholesterolemia, unspecified: Secondary | ICD-10-CM | POA: Diagnosis not present

## 2020-09-13 DIAGNOSIS — Z79899 Other long term (current) drug therapy: Secondary | ICD-10-CM | POA: Diagnosis not present

## 2020-09-13 DIAGNOSIS — Z6829 Body mass index (BMI) 29.0-29.9, adult: Secondary | ICD-10-CM | POA: Diagnosis not present

## 2020-09-13 DIAGNOSIS — I10 Essential (primary) hypertension: Secondary | ICD-10-CM | POA: Diagnosis not present

## 2020-09-13 DIAGNOSIS — Z7189 Other specified counseling: Secondary | ICD-10-CM | POA: Diagnosis not present

## 2020-09-13 DIAGNOSIS — R5383 Other fatigue: Secondary | ICD-10-CM | POA: Diagnosis not present

## 2020-09-13 DIAGNOSIS — Z1339 Encounter for screening examination for other mental health and behavioral disorders: Secondary | ICD-10-CM | POA: Diagnosis not present

## 2020-09-13 DIAGNOSIS — E559 Vitamin D deficiency, unspecified: Secondary | ICD-10-CM | POA: Diagnosis not present

## 2020-09-13 DIAGNOSIS — Z1331 Encounter for screening for depression: Secondary | ICD-10-CM | POA: Diagnosis not present

## 2020-09-13 DIAGNOSIS — Z299 Encounter for prophylactic measures, unspecified: Secondary | ICD-10-CM | POA: Diagnosis not present

## 2020-09-13 DIAGNOSIS — Z0000 Encounter for general adult medical examination without abnormal findings: Secondary | ICD-10-CM | POA: Diagnosis not present

## 2020-09-20 ENCOUNTER — Other Ambulatory Visit: Payer: Self-pay

## 2020-09-20 ENCOUNTER — Other Ambulatory Visit: Payer: Self-pay | Admitting: Cardiology

## 2020-09-20 MED ORDER — ISOSORBIDE MONONITRATE ER 30 MG PO TB24: ORAL_TABLET | ORAL | 3 refills | Status: DC

## 2020-09-20 NOTE — Telephone Encounter (Signed)
Refilled Imdur to [facility]

## 2020-10-04 DIAGNOSIS — H2513 Age-related nuclear cataract, bilateral: Secondary | ICD-10-CM | POA: Diagnosis not present

## 2020-10-04 DIAGNOSIS — H25013 Cortical age-related cataract, bilateral: Secondary | ICD-10-CM | POA: Diagnosis not present

## 2020-10-04 DIAGNOSIS — H35361 Drusen (degenerative) of macula, right eye: Secondary | ICD-10-CM | POA: Diagnosis not present

## 2020-10-04 DIAGNOSIS — H353113 Nonexudative age-related macular degeneration, right eye, advanced atrophic without subfoveal involvement: Secondary | ICD-10-CM | POA: Diagnosis not present

## 2020-10-04 DIAGNOSIS — H524 Presbyopia: Secondary | ICD-10-CM | POA: Diagnosis not present

## 2020-10-04 DIAGNOSIS — H5203 Hypermetropia, bilateral: Secondary | ICD-10-CM | POA: Diagnosis not present

## 2020-10-04 DIAGNOSIS — H11153 Pinguecula, bilateral: Secondary | ICD-10-CM | POA: Diagnosis not present

## 2020-10-04 DIAGNOSIS — H52223 Regular astigmatism, bilateral: Secondary | ICD-10-CM | POA: Diagnosis not present

## 2020-10-04 DIAGNOSIS — H3540 Unspecified peripheral retinal degeneration: Secondary | ICD-10-CM | POA: Diagnosis not present

## 2020-10-04 DIAGNOSIS — H35411 Lattice degeneration of retina, right eye: Secondary | ICD-10-CM | POA: Diagnosis not present

## 2020-10-04 DIAGNOSIS — H3589 Other specified retinal disorders: Secondary | ICD-10-CM | POA: Diagnosis not present

## 2020-10-04 DIAGNOSIS — H35443 Age-related reticular degeneration of retina, bilateral: Secondary | ICD-10-CM | POA: Diagnosis not present

## 2020-10-14 ENCOUNTER — Other Ambulatory Visit: Payer: Self-pay | Admitting: Cardiology

## 2020-11-12 DIAGNOSIS — E785 Hyperlipidemia, unspecified: Secondary | ICD-10-CM | POA: Diagnosis not present

## 2020-11-12 DIAGNOSIS — I10 Essential (primary) hypertension: Secondary | ICD-10-CM | POA: Diagnosis not present

## 2020-12-13 DIAGNOSIS — I10 Essential (primary) hypertension: Secondary | ICD-10-CM | POA: Diagnosis not present

## 2020-12-13 DIAGNOSIS — E785 Hyperlipidemia, unspecified: Secondary | ICD-10-CM | POA: Diagnosis not present

## 2020-12-15 DIAGNOSIS — Z85828 Personal history of other malignant neoplasm of skin: Secondary | ICD-10-CM | POA: Diagnosis not present

## 2020-12-15 DIAGNOSIS — L821 Other seborrheic keratosis: Secondary | ICD-10-CM | POA: Diagnosis not present

## 2020-12-15 DIAGNOSIS — L570 Actinic keratosis: Secondary | ICD-10-CM | POA: Diagnosis not present

## 2020-12-15 DIAGNOSIS — D1801 Hemangioma of skin and subcutaneous tissue: Secondary | ICD-10-CM | POA: Diagnosis not present

## 2021-02-05 ENCOUNTER — Other Ambulatory Visit: Payer: Self-pay | Admitting: Cardiology

## 2021-02-09 ENCOUNTER — Telehealth: Payer: Self-pay | Admitting: Cardiology

## 2021-02-09 DIAGNOSIS — E785 Hyperlipidemia, unspecified: Secondary | ICD-10-CM | POA: Diagnosis not present

## 2021-02-09 DIAGNOSIS — I10 Essential (primary) hypertension: Secondary | ICD-10-CM | POA: Diagnosis not present

## 2021-02-09 NOTE — Telephone Encounter (Signed)
Spoke with [facility] who confirmed they did receive refill for Coreg however pt just picked up refill of this 4 days ago - refill on file and pt voiced understanding

## 2021-02-09 NOTE — Telephone Encounter (Signed)
Patient called stating [name] did not receive her refill for the Coreg

## 2021-03-14 ENCOUNTER — Other Ambulatory Visit: Payer: Self-pay | Admitting: Cardiology

## 2021-03-21 ENCOUNTER — Telehealth: Payer: Self-pay | Admitting: Cardiology

## 2021-03-21 MED ORDER — POTASSIUM CHLORIDE CRYS ER 20 MEQ PO TBCR: 30.0000 meq | EXTENDED_RELEASE_TABLET | Freq: Two times a day (BID) | ORAL | 1 refills | Status: DC

## 2021-03-21 NOTE — Telephone Encounter (Signed)
   1. Which medications need to be refilled? (please list name of each medication and dose if known)   potassium chloride SA (KLOR-CON) 20 MEQ tablet    2. Which pharmacy/location (including street and city if local pharmacy) is medication to be sent to?  [facility]  3. Do they need a 30 day or 90 day supply?

## 2021-03-22 DIAGNOSIS — H353113 Nonexudative age-related macular degeneration, right eye, advanced atrophic without subfoveal involvement: Secondary | ICD-10-CM | POA: Diagnosis not present

## 2021-04-11 DIAGNOSIS — E875 Hyperkalemia: Secondary | ICD-10-CM | POA: Diagnosis not present

## 2021-04-11 DIAGNOSIS — M549 Dorsalgia, unspecified: Secondary | ICD-10-CM | POA: Diagnosis not present

## 2021-05-12 DIAGNOSIS — M549 Dorsalgia, unspecified: Secondary | ICD-10-CM | POA: Diagnosis not present

## 2021-05-12 DIAGNOSIS — E875 Hyperkalemia: Secondary | ICD-10-CM | POA: Diagnosis not present

## 2021-06-14 DIAGNOSIS — L814 Other melanin hyperpigmentation: Secondary | ICD-10-CM | POA: Diagnosis not present

## 2021-06-14 DIAGNOSIS — L570 Actinic keratosis: Secondary | ICD-10-CM | POA: Diagnosis not present

## 2021-06-14 DIAGNOSIS — Z85828 Personal history of other malignant neoplasm of skin: Secondary | ICD-10-CM | POA: Diagnosis not present

## 2021-06-14 DIAGNOSIS — Z1283 Encounter for screening for malignant neoplasm of skin: Secondary | ICD-10-CM | POA: Diagnosis not present

## 2021-07-13 DIAGNOSIS — M549 Dorsalgia, unspecified: Secondary | ICD-10-CM | POA: Diagnosis not present

## 2021-07-13 DIAGNOSIS — E875 Hyperkalemia: Secondary | ICD-10-CM | POA: Diagnosis not present

## 2021-07-21 ENCOUNTER — Other Ambulatory Visit: Payer: Self-pay | Admitting: Cardiology

## 2021-08-18 ENCOUNTER — Other Ambulatory Visit: Payer: Self-pay | Admitting: Cardiology

## 2021-09-02 ENCOUNTER — Ambulatory Visit: Payer: Medicare Other | Admitting: Cardiology

## 2021-09-12 DIAGNOSIS — E875 Hyperkalemia: Secondary | ICD-10-CM | POA: Diagnosis not present

## 2021-09-12 DIAGNOSIS — M549 Dorsalgia, unspecified: Secondary | ICD-10-CM | POA: Diagnosis not present

## 2021-09-15 DIAGNOSIS — Z1331 Encounter for screening for depression: Secondary | ICD-10-CM | POA: Diagnosis not present

## 2021-09-15 DIAGNOSIS — Z7189 Other specified counseling: Secondary | ICD-10-CM | POA: Diagnosis not present

## 2021-09-15 DIAGNOSIS — I10 Essential (primary) hypertension: Secondary | ICD-10-CM | POA: Diagnosis not present

## 2021-09-15 DIAGNOSIS — D692 Other nonthrombocytopenic purpura: Secondary | ICD-10-CM | POA: Diagnosis not present

## 2021-09-15 DIAGNOSIS — Z0000 Encounter for general adult medical examination without abnormal findings: Secondary | ICD-10-CM | POA: Diagnosis not present

## 2021-09-15 DIAGNOSIS — R5383 Other fatigue: Secondary | ICD-10-CM | POA: Diagnosis not present

## 2021-09-15 DIAGNOSIS — Z1339 Encounter for screening examination for other mental health and behavioral disorders: Secondary | ICD-10-CM | POA: Diagnosis not present

## 2021-09-15 DIAGNOSIS — Z299 Encounter for prophylactic measures, unspecified: Secondary | ICD-10-CM | POA: Diagnosis not present

## 2021-09-15 DIAGNOSIS — Z789 Other specified health status: Secondary | ICD-10-CM | POA: Diagnosis not present

## 2021-09-15 DIAGNOSIS — Z6829 Body mass index (BMI) 29.0-29.9, adult: Secondary | ICD-10-CM | POA: Diagnosis not present

## 2021-09-15 DIAGNOSIS — E1165 Type 2 diabetes mellitus with hyperglycemia: Secondary | ICD-10-CM | POA: Diagnosis not present

## 2021-09-16 DIAGNOSIS — E7800 Pure hypercholesterolemia, unspecified: Secondary | ICD-10-CM | POA: Diagnosis not present

## 2021-09-16 DIAGNOSIS — Z79899 Other long term (current) drug therapy: Secondary | ICD-10-CM | POA: Diagnosis not present

## 2021-09-16 DIAGNOSIS — R5383 Other fatigue: Secondary | ICD-10-CM | POA: Diagnosis not present

## 2021-09-16 DIAGNOSIS — E559 Vitamin D deficiency, unspecified: Secondary | ICD-10-CM | POA: Diagnosis not present

## 2021-09-20 DIAGNOSIS — H353123 Nonexudative age-related macular degeneration, left eye, advanced atrophic without subfoveal involvement: Secondary | ICD-10-CM | POA: Diagnosis not present

## 2021-09-27 ENCOUNTER — Encounter: Payer: Self-pay | Admitting: Cardiology

## 2021-09-27 ENCOUNTER — Ambulatory Visit (INDEPENDENT_AMBULATORY_CARE_PROVIDER_SITE_OTHER): Payer: Medicare Other | Admitting: Cardiology

## 2021-09-27 VITALS — BP 126/60 | HR 80 | Ht 59.0 in | Wt 147.6 lb

## 2021-09-27 DIAGNOSIS — I2510 Atherosclerotic heart disease of native coronary artery without angina pectoris: Secondary | ICD-10-CM | POA: Diagnosis not present

## 2021-09-27 DIAGNOSIS — E782 Mixed hyperlipidemia: Secondary | ICD-10-CM | POA: Diagnosis not present

## 2021-09-27 DIAGNOSIS — I10 Essential (primary) hypertension: Secondary | ICD-10-CM | POA: Diagnosis not present

## 2021-09-27 NOTE — Progress Notes (Signed)
Clinical Summary [name] is a 70 y.o.female seen today for follow up of the following medical problems   1. CAD - hx of NSTEMI in 2008, received a BMS to the LCX.   - 04/2014 echo: LVEF 60-65%     - no recent chest pains. No SOB/DOE - compliant with meds     2. PAD - mild left renal artery stenosis, bp controlled with no reported history of renal disease.   - severe ostial SMA stenosis, denies any stomach pain.   - mild to moderate bilateral carotid stenosis by most recent US Jan 2016. Denies any neurological symptoms.   3. HTN - she is compliantw ith meds     4. Hyperlipidemia - she is compliant with statin.    - pcp follows labs      SH: upcoming trip 69 year anniversary, taking a cruise to [place]   Past Medical History:  Diagnosis Date   Anxiety and depression    CAD (coronary artery disease)    NSTEMI/BMS pCFX, 01/08   Colitis    CVD (cerebrovascular disease)    non obstructive   Dyslipidemia    HTN (hypertension)    PAD (peripheral artery disease) (HCC)    mild left renal artery stenosis; severe osital SMA stenosis     Allergies  Allergen Reactions   Penicillins     REACTION: ?rash     Current Outpatient Medications  Medication Sig Dispense Refill   ALPRAZolam (XANAX) 0.25 MG tablet Take 0.25 mg by mouth 2 (two) times daily as needed.       amLODipine (NORVASC) 10 MG tablet Take 1 tablet (10 mg total) by mouth daily. 90 tablet 2   aspirin 81 MG EC tablet Take 81 mg by mouth daily.       atorvastatin (LIPITOR) 80 MG tablet TAKE ONE TABLET BY MOUTH DAILY. 90 tablet 1   carvedilol (COREG) 12.5 MG tablet TAKE ONE TABLET BY MOUTH TWICE DAILY 180 tablet 1   Cholecalciferol (VITAMIN D3) 2000 UNITS capsule Take 2,000 Units by mouth daily.     CINNAMON PO Take 1,000 mg by mouth 2 (two) times daily.     fluticasone (FLONASE) 50 MCG/ACT nasal spray Place 1 spray into both nostrils daily as needed for allergies or rhinitis.      hydrochlorothiazide  (HYDRODIURIL) 25 MG tablet TAKE ONE TABLET BY MOUTH DAILY 30 tablet 0   isosorbide mononitrate (IMDUR) 30 MG 24 hr tablet TAKE ONE TABLET BY MOUTH DAILY. 30 tablet 0   magnesium oxide (MAG-OX) 400 MG tablet Take 400 mg by mouth 2 (two) times daily.     Multiple Vitamins-Minerals (ONE-A-DAY EXTRAS ANTIOXIDANT) CAPS Take 1 capsule by mouth daily.       Multiple Vitamins-Minerals (PRESERVISION/LUTEIN) CAPS Take 1 capsule by mouth 2 (two) times daily.       nitroGLYCERIN (NITROSTAT) 0.4 MG SL tablet Place 1 tablet (0.4 mg total) under the tongue every 5 (five) minutes as needed. 25 tablet 3   Omega-3 Fatty Acids (FISH OIL) 1200 MG CAPS Take 1 capsule by mouth 2 (two) times a day.      potassium chloride SA (KLOR-CON) 20 MEQ tablet TAKE 1 AND 1/2 TABLETS BY MOUTH TWICE DAILY 270 tablet 1   venlafaxine (EFFEXOR-XR) 75 MG 24 hr capsule Take 225 mg by mouth daily.       No current facility-administered medications for this visit.     Past Surgical History:  Procedure Laterality Date  ABDOMINAL HYSTERECTOMY     CORONARY ANGIOPLASTY WITH STENT PLACEMENT  2008   BMS x 2 CFX     Allergies  Allergen Reactions   Penicillins     REACTION: ?rash      Family History  Problem Relation Age of Onset   Cirrhosis Mother    COPD Father    Asthma Maternal Grandmother    Diabetes Neg Hx    Hypertension Neg Hx    Coronary artery disease Neg Hx      Social History Ms. Milian reports that she has never smoked. She has never used smokeless tobacco. Ms. Matthew has no history on file for alcohol use.   Review of Systems CONSTITUTIONAL: No weight loss, fever, chills, weakness or fatigue.  HEENT: Eyes: No visual loss, blurred vision, double vision or yellow sclerae.No hearing loss, sneezing, congestion, runny nose or sore throat.  SKIN: No rash or itching.  CARDIOVASCULAR: per hpi RESPIRATORY: No shortness of breath, cough or sputum.  GASTROINTESTINAL: No anorexia, nausea, vomiting or diarrhea.  No abdominal pain or blood.  GENITOURINARY: No burning on urination, no polyuria NEUROLOGICAL: No headache, dizziness, syncope, paralysis, ataxia, numbness or tingling in the extremities. No change in bowel or bladder control.  MUSCULOSKELETAL: No muscle, back pain, joint pain or stiffness.  LYMPHATICS: No enlarged nodes. No history of splenectomy.  PSYCHIATRIC: No history of depression or anxiety.  ENDOCRINOLOGIC: No reports of sweating, cold or heat intolerance. No polyuria or polydipsia.  Marland Kitchen   Physical Examination Today's Vitals   09/27/21 1443  BP: 126/60  Pulse: 80  SpO2: 96%  Weight: 147 lb 9.6 oz (67 kg)  Height: 4\' 11"  (1.499 m)   Body mass index is 29.81 kg/m.  Gen: resting comfortably, no acute distress HEENT: no scleral icterus, pupils equal round and reactive, no palptable cervical adenopathy,  CV: RRR, no m/r/g, no jvd Resp: Clear to auscultation bilaterally GI: abdomen is soft, non-tender, non-distended, normal bowel sounds, no hepatosplenomegaly MSK: extremities are warm, no edema.  Skin: warm, no rash Neuro:  no focal deficits Psych: appropriate affect   Diagnostic Studies 11/2006 Cath FINDINGS:   1. LV: 133/13/35.   2. No aortic stenosis on pullback.   3. Left ventriculography: This was deferred due to markedly elevated   left ventricular end diastolic pressure.   4. Left main: There is no left main.   5. LAD: A fairly large vessel which wraps the apex of the heart. It   gives rise to a moderate sized diagonal. The proximal LAD is   heavily calcified. There is diffuse 30% stenosis. There is then a   focal 50% stenosis just after the origin of the diagonal.   6. Circumflex: Fairly large codominant vessel. There was a 99%   stenosis proximally and a 60% stenosis just after the marginal   which were stented using two overlapping bare metal stents. Flow   improved from TIMI II to TIMI III.   7. RCA: Relatively small, codominant vessel. There is a 30%  stenosis   in the mid vessel just after the origin of acute marginal. The   acute marginal has a 90% ostial stenosis. It is a fairly small   vessel.   RECOMMENDATIONS: Successful percutaneous revascularization of the   proximal circumflex. A small non flow limiting proximal edge dissection   required an additional overlapping stent. There was an excellent final   result. Her left ventricular end diastolic pressure is markedly   elevated. I, therefore, did not  perform ventriculography. We will plan   on continuation of beta blocker in addition to ACE inhibitor. We will   continue aspirin indefinitely. Plavix should be continued for a minimum   of 30 days and preferably nine months given the acute presentation. She   will need echocardiogram to assess left ventricular systolic function.   12/2006 Cath HEMODYNAMIC RESULTS: Aorta 143/74 mmHg, left ventricle 140/28 mmHg.   ANGIOGRAPHIC FINDINGS:   1. There is a very short left main, essentially nearby separate ostia   of the left anterior descending and circumflex coronary arteries.   2. The left anterior descending is a medium to small caliber vessel   with distal tapering and 30% midvessel stenosis. There is   essentially one medium-sized branching diagonal. No flow-limiting   stenoses are noted.   3. The circumflex coronary artery is a medium to large caliber vessel   that is codominant with a small right coronary artery. Stent site   is visualized from the proximal portion of the vessel and was   widely patent. Otherwise there are minor luminal irregularities.   There is a ramus intermedius and a large multi-branched obtuse   marginal in the midvessel segment.   4. The right coronary artery is small and codominant. There is an   acute marginal visualized with approximately 80% ostial stenosis   which is not a new finding. Otherwise minor luminal irregularities   are noted.   5. Left ventriculography is performed in the RAO projection  and   reveals an ejection fraction approximately 75% with no focal   anterior or inferior wall motion abnormality and trace mitral   regurgitation in the setting of ventricular ectopy.   DIAGNOSIS:   1. Mild coronary atherosclerosis as discussed above with widely patent   stent site within the proximal circumflex.   2. Left ventricular ejection fraction approximately 75% with an   increased left ventricular end-diastolic pressure of 28 mmHg, trace   mitral regurgitation in the setting of ventricular ectopy, and no   significant pullback gradient.   DISCUSSION: I reviewed the results with the patient and with Dr. Dannielle Burn   by phone. At this point I would anticipate continued medical therapy.   Discharge will be planned for later today with follow-up in the [facility]   office with Dr. Dannielle Burn.       07/2013 Carotid US RICA 8-46%, LICA 65-99%, patent antegrade vertebrals   04/2014 echo Study Conclusions  - Left ventricle: The cavity size was normal. Wall thickness was   increased in a pattern of mild LVH. Systolic function was normal.   The estimated ejection fraction was in the range of 60% to 65%.   There is evidence of diastolic dysfunction, indeterminant grade.   Wall motion was normal; there were no regional wall motion   abnormalities. - Aortic valve: Mildly calcified annulus. Mildly thickened   leaflets. There is aortic sclerosis without stenosis. Valve area   (VTI): 2.89 cm^2. Valve area (Vmax): 2.58 cm^2. - Mitral valve: There was mild regurgitation. - Technically adequate study.   Jan 2016 Carotid US RICA 3-57%, LICA 01-77%, >93% ECA bilateral, right subclavian stenosis   04/2016 Carotid US RICA 9-03%, LICA 00-92%            Assessment and Plan   1. CAD - no symptoms, continue current meds - EKG shows SR, no ischemic changes       2. HTN -bp is at goal, continue current meds  3. Hyperlipidemia -request pcp labs, continue atrovastatin    F/u 1  year    Arnoldo Lenis, M.D.

## 2021-09-27 NOTE — Patient Instructions (Signed)

## 2021-09-28 ENCOUNTER — Encounter: Payer: Self-pay | Admitting: *Deleted

## 2021-10-04 DIAGNOSIS — E1165 Type 2 diabetes mellitus with hyperglycemia: Secondary | ICD-10-CM | POA: Diagnosis not present

## 2021-10-04 DIAGNOSIS — I10 Essential (primary) hypertension: Secondary | ICD-10-CM | POA: Diagnosis not present

## 2021-10-04 DIAGNOSIS — Z299 Encounter for prophylactic measures, unspecified: Secondary | ICD-10-CM | POA: Diagnosis not present

## 2021-10-04 DIAGNOSIS — R7989 Other specified abnormal findings of blood chemistry: Secondary | ICD-10-CM | POA: Diagnosis not present

## 2021-10-05 ENCOUNTER — Ambulatory Visit
Admission: RE | Admit: 2021-10-05 | Discharge: 2021-10-05 | Disposition: A | Discharge status: Home / Self Care | Payer: Medicare Other | Source: Ambulatory Visit | Attending: Internal Medicine | Admitting: Internal Medicine

## 2021-10-05 ENCOUNTER — Other Ambulatory Visit: Payer: Self-pay | Admitting: Internal Medicine

## 2021-10-05 ENCOUNTER — Other Ambulatory Visit: Payer: Self-pay

## 2021-10-05 DIAGNOSIS — Z1231 Encounter for screening mammogram for malignant neoplasm of breast: Secondary | ICD-10-CM

## 2021-10-12 DIAGNOSIS — I10 Essential (primary) hypertension: Secondary | ICD-10-CM | POA: Diagnosis not present

## 2021-10-12 DIAGNOSIS — E7800 Pure hypercholesterolemia, unspecified: Secondary | ICD-10-CM | POA: Diagnosis not present

## 2021-10-19 ENCOUNTER — Other Ambulatory Visit: Payer: Self-pay | Admitting: Cardiology

## 2021-11-24 DIAGNOSIS — Z23 Encounter for immunization: Secondary | ICD-10-CM | POA: Diagnosis not present

## 2021-12-13 DIAGNOSIS — E7800 Pure hypercholesterolemia, unspecified: Secondary | ICD-10-CM | POA: Diagnosis not present

## 2021-12-13 DIAGNOSIS — I10 Essential (primary) hypertension: Secondary | ICD-10-CM | POA: Diagnosis not present

## 2021-12-14 DIAGNOSIS — Z1283 Encounter for screening for malignant neoplasm of skin: Secondary | ICD-10-CM | POA: Diagnosis not present

## 2021-12-14 DIAGNOSIS — L570 Actinic keratosis: Secondary | ICD-10-CM | POA: Diagnosis not present

## 2021-12-14 DIAGNOSIS — Z85828 Personal history of other malignant neoplasm of skin: Secondary | ICD-10-CM | POA: Diagnosis not present

## 2022-01-03 IMAGING — MG MM DIGITAL SCREENING BILAT W/ TOMO AND CAD
8 series · 9 of 24 positions shown · non-contrast
Comparison: Previous exam(s).

CLINICAL DATA: Screening.

EXAM:
DIGITAL SCREENING BILATERAL MAMMOGRAM WITH TOMOSYNTHESIS AND CAD
TECHNIQUE: Bilateral screening digital craniocaudal and mediolateral oblique
mammograms were obtained. Bilateral screening digital breast
tomosynthesis was performed. The images were evaluated with
computer-aided detection.

[L MLO synth-2D]
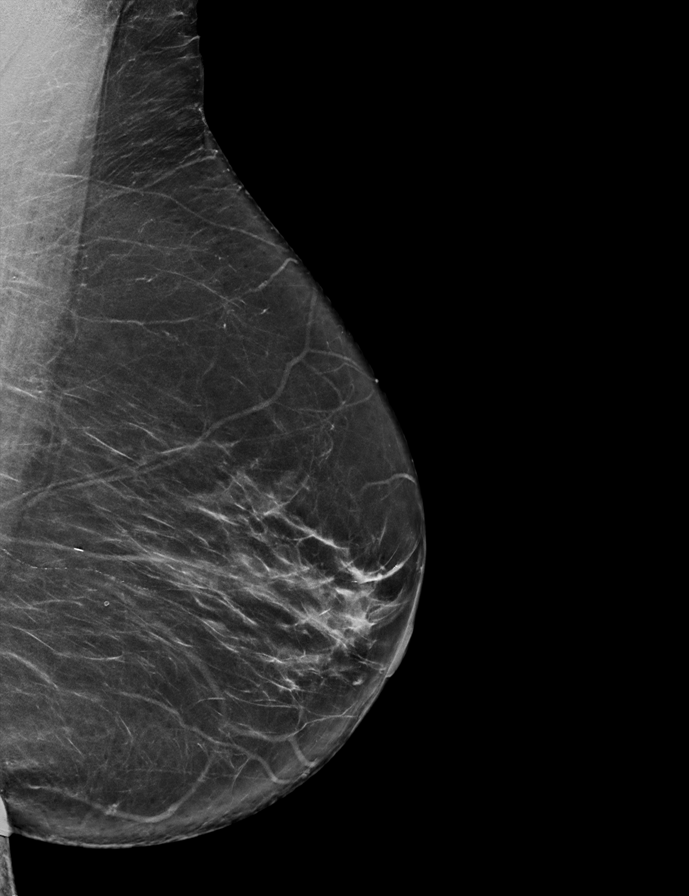

[R MLO synth-2D]
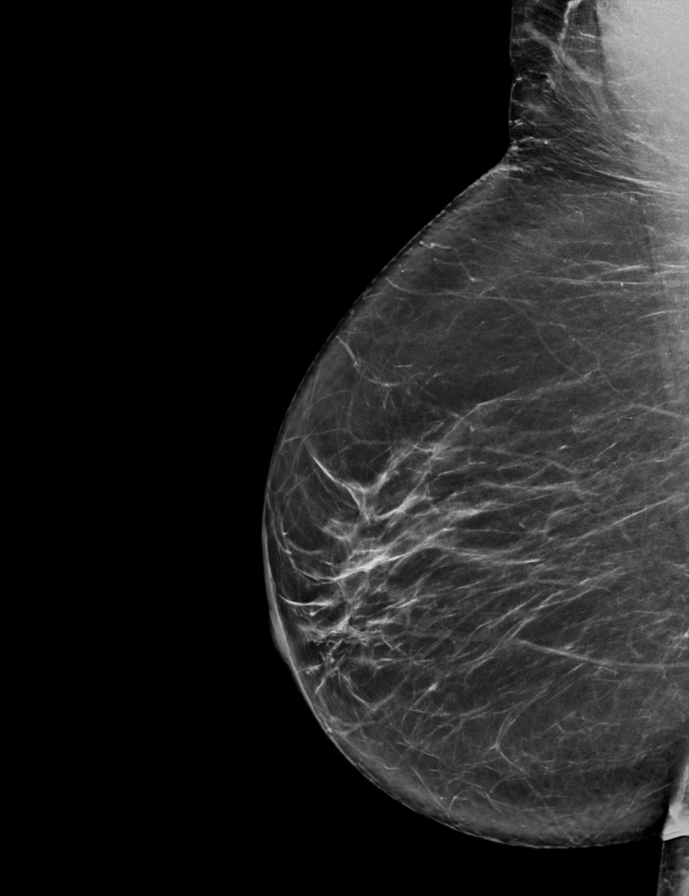

[L CC synth-2D]
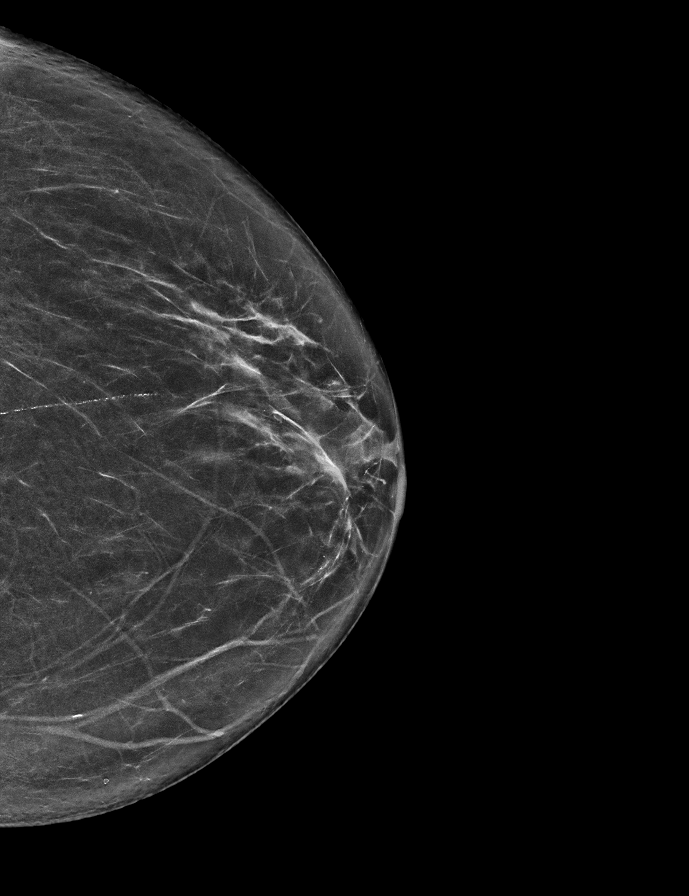

[R CC synth-2D]
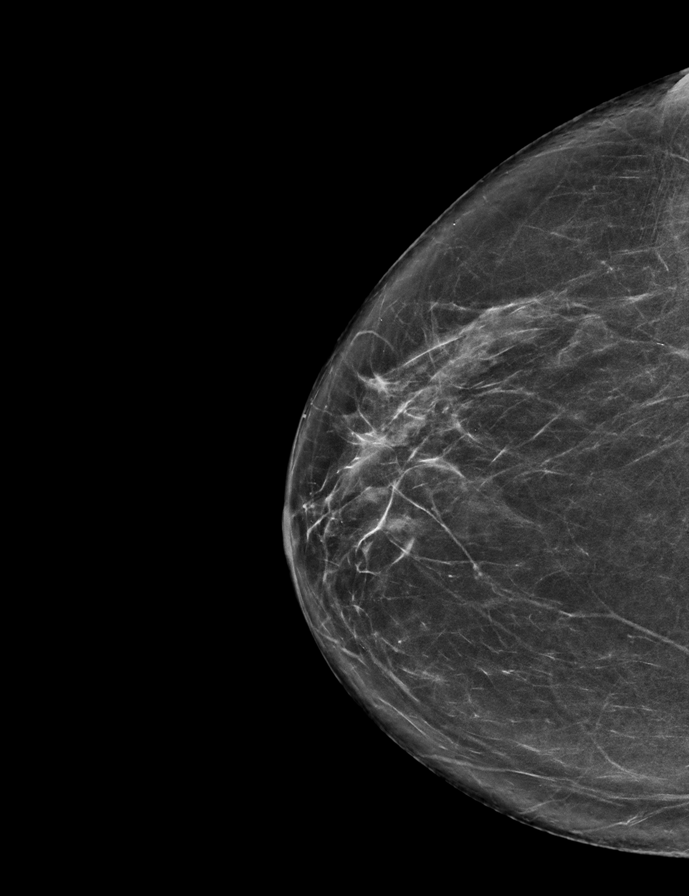

[L CC tomo · 2 of 64 frames shown]
[frame 21/64]
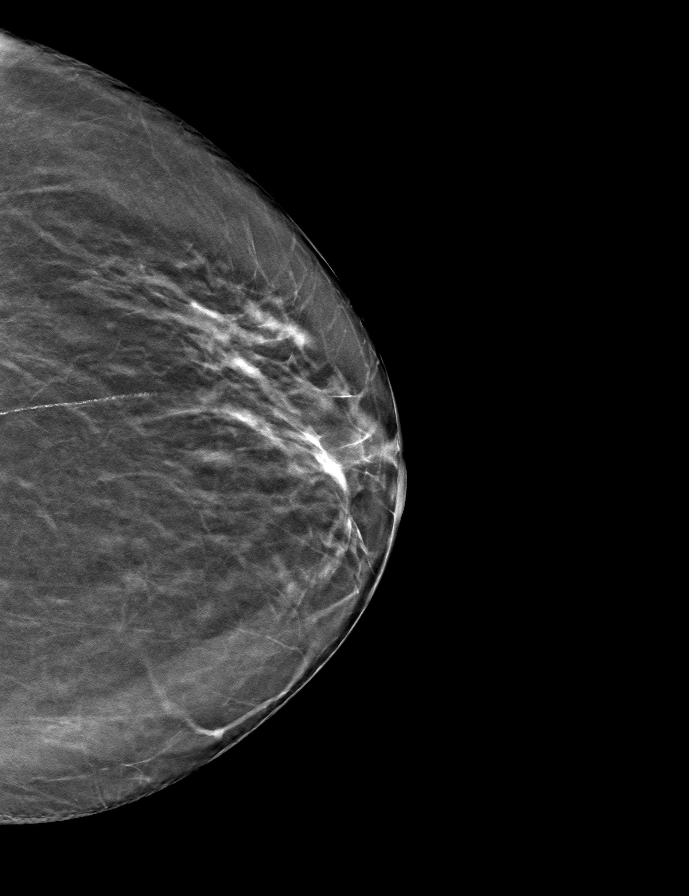
[frame 33/64]
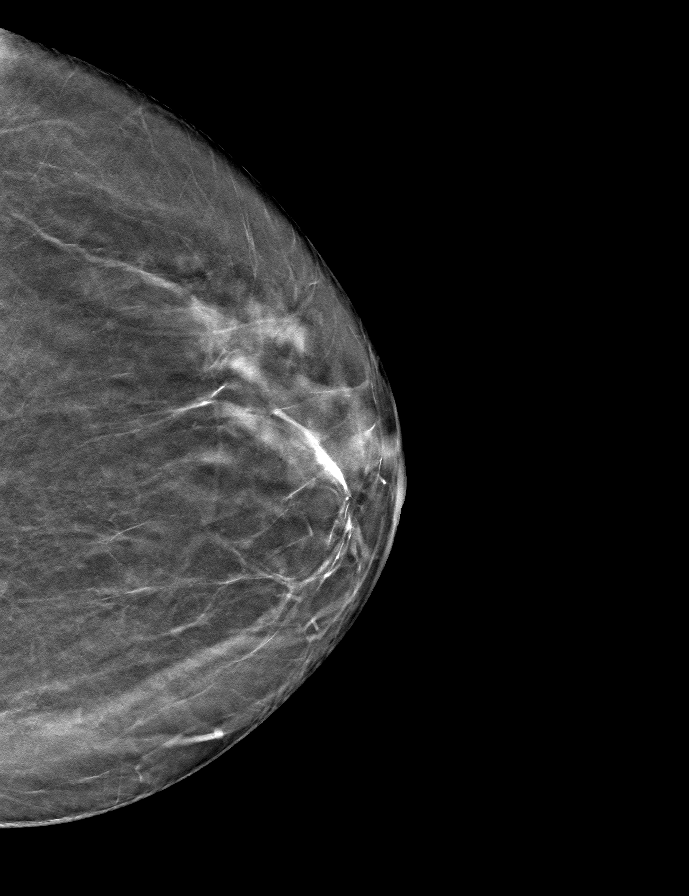

[R CC tomo · tomo slice 35/68.0]
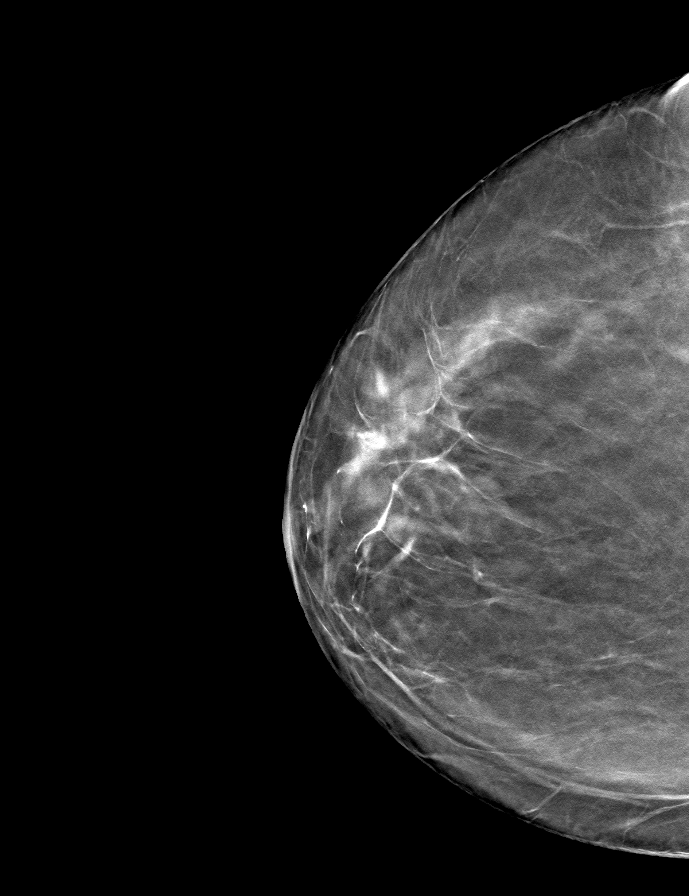

[L MLO tomo · tomo slice 34/67.0]
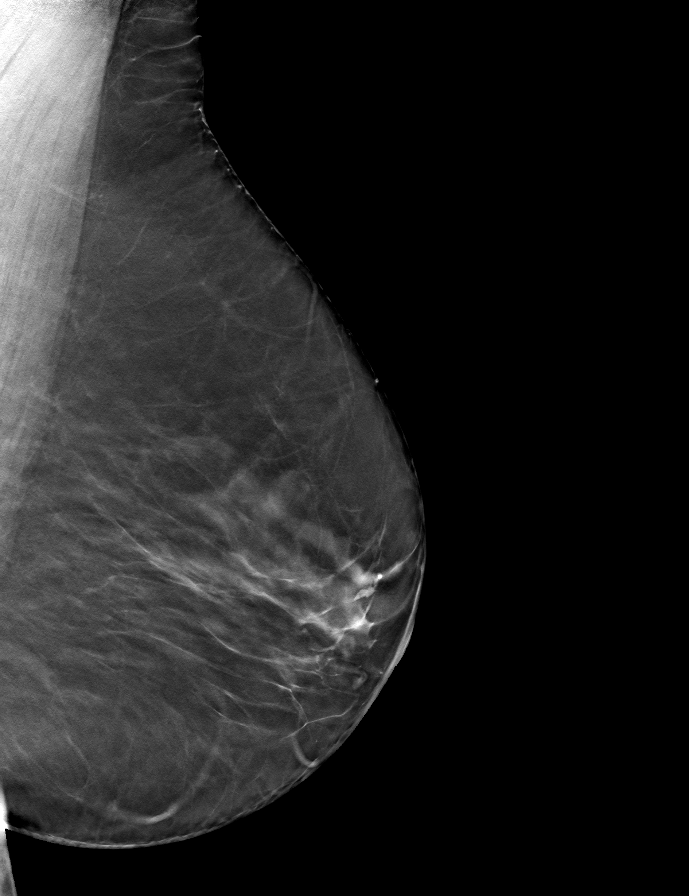

[R MLO tomo · tomo slice 35/68.0]
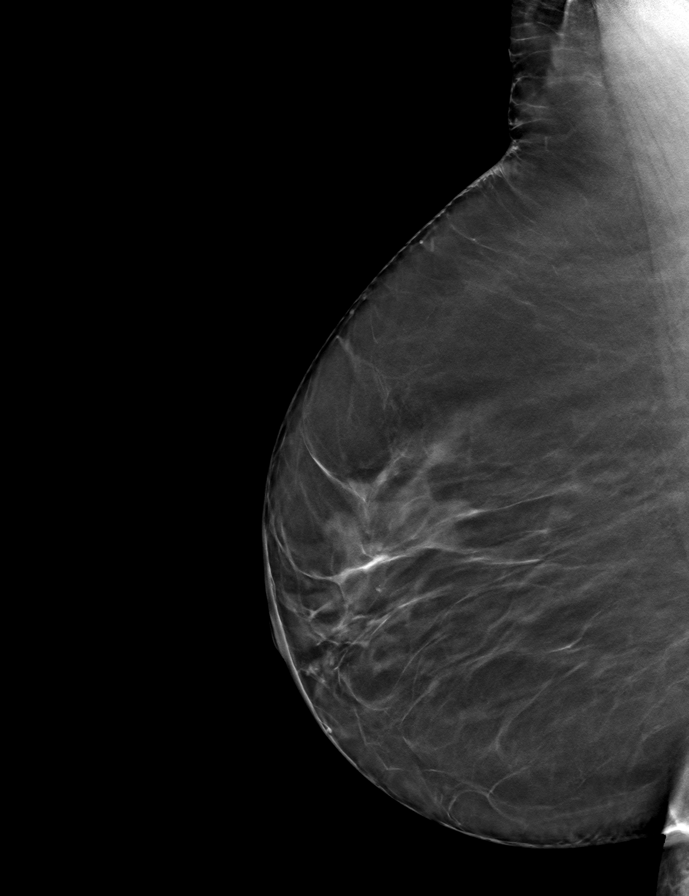

[9 of 24 positions shown; findings below may reference images not displayed]

ACR Breast Density Category b: There are scattered areas of
fibroglandular density.
FINDINGS: There are no findings suspicious for malignancy.
IMPRESSION: No mammographic evidence of malignancy. A result letter of this
screening mammogram will be mailed directly to the patient.

RECOMMENDATION:
Screening mammogram in one year. (Code:51-O-LD2)

BI-RADS CATEGORY  1: Negative.

## 2022-01-10 DIAGNOSIS — F419 Anxiety disorder, unspecified: Secondary | ICD-10-CM | POA: Diagnosis not present

## 2022-01-10 DIAGNOSIS — E1165 Type 2 diabetes mellitus with hyperglycemia: Secondary | ICD-10-CM | POA: Diagnosis not present

## 2022-01-10 DIAGNOSIS — Z299 Encounter for prophylactic measures, unspecified: Secondary | ICD-10-CM | POA: Diagnosis not present

## 2022-01-10 DIAGNOSIS — I10 Essential (primary) hypertension: Secondary | ICD-10-CM | POA: Diagnosis not present

## 2022-01-12 ENCOUNTER — Other Ambulatory Visit: Payer: Self-pay | Admitting: Cardiology

## 2022-03-13 DIAGNOSIS — Z789 Other specified health status: Secondary | ICD-10-CM | POA: Diagnosis not present

## 2022-03-13 DIAGNOSIS — Z299 Encounter for prophylactic measures, unspecified: Secondary | ICD-10-CM | POA: Diagnosis not present

## 2022-03-13 DIAGNOSIS — D692 Other nonthrombocytopenic purpura: Secondary | ICD-10-CM | POA: Diagnosis not present

## 2022-03-13 DIAGNOSIS — E1165 Type 2 diabetes mellitus with hyperglycemia: Secondary | ICD-10-CM | POA: Diagnosis not present

## 2022-03-13 DIAGNOSIS — I10 Essential (primary) hypertension: Secondary | ICD-10-CM | POA: Diagnosis not present

## 2022-04-03 DIAGNOSIS — H353113 Nonexudative age-related macular degeneration, right eye, advanced atrophic without subfoveal involvement: Secondary | ICD-10-CM | POA: Diagnosis not present

## 2022-06-13 ENCOUNTER — Other Ambulatory Visit: Payer: Self-pay | Admitting: Cardiology

## 2022-06-19 DIAGNOSIS — Z1283 Encounter for screening for malignant neoplasm of skin: Secondary | ICD-10-CM | POA: Diagnosis not present

## 2022-06-19 DIAGNOSIS — Z85828 Personal history of other malignant neoplasm of skin: Secondary | ICD-10-CM | POA: Diagnosis not present

## 2022-06-19 DIAGNOSIS — L570 Actinic keratosis: Secondary | ICD-10-CM | POA: Diagnosis not present

## 2022-06-20 DIAGNOSIS — I10 Essential (primary) hypertension: Secondary | ICD-10-CM | POA: Diagnosis not present

## 2022-06-20 DIAGNOSIS — Z299 Encounter for prophylactic measures, unspecified: Secondary | ICD-10-CM | POA: Diagnosis not present

## 2022-06-20 DIAGNOSIS — E7800 Pure hypercholesterolemia, unspecified: Secondary | ICD-10-CM | POA: Diagnosis not present

## 2022-06-20 DIAGNOSIS — E1165 Type 2 diabetes mellitus with hyperglycemia: Secondary | ICD-10-CM | POA: Diagnosis not present

## 2022-07-03 DIAGNOSIS — H01002 Unspecified blepharitis right lower eyelid: Secondary | ICD-10-CM | POA: Diagnosis not present

## 2022-07-03 DIAGNOSIS — H01001 Unspecified blepharitis right upper eyelid: Secondary | ICD-10-CM | POA: Diagnosis not present

## 2022-07-03 DIAGNOSIS — H25813 Combined forms of age-related cataract, bilateral: Secondary | ICD-10-CM | POA: Diagnosis not present

## 2022-07-03 DIAGNOSIS — H353132 Nonexudative age-related macular degeneration, bilateral, intermediate dry stage: Secondary | ICD-10-CM | POA: Diagnosis not present

## 2022-07-05 DIAGNOSIS — F419 Anxiety disorder, unspecified: Secondary | ICD-10-CM | POA: Diagnosis not present

## 2022-07-05 DIAGNOSIS — Z299 Encounter for prophylactic measures, unspecified: Secondary | ICD-10-CM | POA: Diagnosis not present

## 2022-07-05 DIAGNOSIS — I10 Essential (primary) hypertension: Secondary | ICD-10-CM | POA: Diagnosis not present

## 2022-07-13 ENCOUNTER — Other Ambulatory Visit: Payer: Self-pay | Admitting: Cardiology

## 2022-07-18 DIAGNOSIS — E2839 Other primary ovarian failure: Secondary | ICD-10-CM | POA: Diagnosis not present

## 2022-07-26 NOTE — H&P (Signed)
Surgical History & Physical  Patient Name: [name] DOB: [birth year]  Surgery: Cataract extraction with intraocular lens implant phacoemulsification; Right Eye  Surgeon: Baruch Goldmann MD Surgery Date:  08-07-22 Pre-Op Date:  07-20-22  HPI: A 71 Yr. old female patient 1. 1. The patient is here for a cataract evaluation of both eyes, referred from Dr. Rosana Hoes. The patient's vision is blurry. The complaint is associated with reading small print on medicine bottles/labels, driving at night due to halos/glare, and reading captions on TV. This is negatively affecting the patient's quality of life and the patient is unable to function adequately in life with the current level of vision. HPI was performed by Baruch Goldmann .  Medical History: Macula Degeneration Glaucoma Cataracts HT Retinopathy, Pinguecula, Reticular retinal deg, retinal drusen Diabetes High Blood Pressure  Review of Systems Negative Allergic/Immunologic Negative Cardiovascular Negative Constitutional Negative Ear, Nose, Mouth & Throat Negative Endocrine Negative Eyes Negative Gastrointestinal Negative Genitourinary Negative Hemotologic/Lymphatic Negative Integumentary Negative Musculoskeletal Negative Neurological Negative Psychiatry Negative Respiratory  Social   Never smoked   Medication  Alprazolam, Amlodipine, Aspirin, Cambral, Carvedilol, Fexofenadine, Effexor, Vytorin, Plavix, Metformin, HCTZ,   Sx/Procedures   None  Drug Allergies  Penicilin,   History & Physical: Heent: Cataract, right eye NECK: supple without bruits LUNGS: lungs clear to auscultation CV: regular rate and rhythm Abdomen: soft and non-tender Impression & Plan: Assessment: 1.  COMBINED FORMS AGE RELATED CATARACT; Both Eyes (H25.813) 2.  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  BLEPHARITIS; Right Upper Lid, Right Lower Lid, Left Upper Lid, Left Lower Lid (H01.001, H01.002,H01.004,H01.005) 4.  DERMATOCHALASIS, no  surgery; Right Upper Lid, Left Upper Lid (H02.831, H02.834) 5.  CONJUNCTIVOCHALASIS; Both Eyes (N40.768)  Plan: 1.  Cataract accounts for the patient's decreased vision. This visual impairment is not correctable with a tolerable change in glasses or contact lenses. Cataract surgery with an implantation of a new lens should significantly improve the visual and functional status of the patient. Discussed all risks, benefits, alternatives, and potential complications. Discussed the procedures and recovery. Patient desires to have surgery. A-scan ordered and performed today for intra-ocular lens calculations. The surgery will be performed in order to improve vision for driving, reading, and for eye examinations. Recommend phacoemulsification with intra-ocular lens. Recommend Dextenza for post-operative pain and inflammation. Right Eye worse - first. Dilates poorly - shugarcaine by protocol. Omidira. ARMD will limit visual potential.  2.  Previous diagnosis. Continue AREDS 2 vitamins. Amsler grid testing weekly.  3.  Recommend regular lid cleaning.  4.  Asymptomatic, recommend observation for now. Findings, prognosis and treatment options reviewed.  5.  Asymptomatic.

## 2022-07-31 ENCOUNTER — Encounter (HOSPITAL_COMMUNITY): Payer: Self-pay

## 2022-07-31 ENCOUNTER — Encounter (HOSPITAL_COMMUNITY)
Admission: RE | Admit: 2022-07-31 | Discharge: 2022-07-31 | Disposition: A | Discharge status: Home / Self Care | Payer: Medicare Other | Source: Ambulatory Visit | Attending: Ophthalmology | Admitting: Ophthalmology

## 2022-07-31 ENCOUNTER — Other Ambulatory Visit: Payer: Self-pay

## 2022-07-31 DIAGNOSIS — H25811 Combined forms of age-related cataract, right eye: Secondary | ICD-10-CM | POA: Diagnosis not present

## 2022-07-31 HISTORY — DX: Acute myocardial infarction, unspecified: I21.9

## 2022-07-31 HISTORY — DX: Type 2 diabetes mellitus without complications: E11.9

## 2022-08-07 ENCOUNTER — Ambulatory Visit (HOSPITAL_COMMUNITY): Payer: Medicare Other | Admitting: Certified Registered Nurse Anesthetist

## 2022-08-07 ENCOUNTER — Encounter (HOSPITAL_COMMUNITY): Payer: Self-pay | Admitting: Ophthalmology

## 2022-08-07 ENCOUNTER — Ambulatory Visit (HOSPITAL_COMMUNITY)
Admission: RE | Admit: 2022-08-07 | Discharge: 2022-08-07 | Disposition: A | Discharge status: Home / Self Care | Payer: Medicare Other | Attending: Ophthalmology | Admitting: Ophthalmology

## 2022-08-07 ENCOUNTER — Encounter (HOSPITAL_COMMUNITY)
Admission: RE | Disposition: A | Discharge status: Home / Self Care | Payer: Self-pay | Source: Home / Self Care | Attending: Ophthalmology

## 2022-08-07 ENCOUNTER — Ambulatory Visit (HOSPITAL_BASED_OUTPATIENT_CLINIC_OR_DEPARTMENT_OTHER): Payer: Medicare Other | Admitting: Certified Registered Nurse Anesthetist

## 2022-08-07 DIAGNOSIS — E1136 Type 2 diabetes mellitus with diabetic cataract: Secondary | ICD-10-CM | POA: Diagnosis not present

## 2022-08-07 DIAGNOSIS — I10 Essential (primary) hypertension: Secondary | ICD-10-CM

## 2022-08-07 DIAGNOSIS — E1151 Type 2 diabetes mellitus with diabetic peripheral angiopathy without gangrene: Secondary | ICD-10-CM

## 2022-08-07 DIAGNOSIS — H0100A Unspecified blepharitis right eye, upper and lower eyelids: Secondary | ICD-10-CM | POA: Diagnosis not present

## 2022-08-07 DIAGNOSIS — F419 Anxiety disorder, unspecified: Secondary | ICD-10-CM | POA: Insufficient documentation

## 2022-08-07 DIAGNOSIS — Z7984 Long term (current) use of oral hypoglycemic drugs: Secondary | ICD-10-CM | POA: Insufficient documentation

## 2022-08-07 DIAGNOSIS — H25811 Combined forms of age-related cataract, right eye: Secondary | ICD-10-CM

## 2022-08-07 DIAGNOSIS — I2510 Atherosclerotic heart disease of native coronary artery without angina pectoris: Secondary | ICD-10-CM | POA: Insufficient documentation

## 2022-08-07 DIAGNOSIS — H0100B Unspecified blepharitis left eye, upper and lower eyelids: Secondary | ICD-10-CM | POA: Insufficient documentation

## 2022-08-07 DIAGNOSIS — H2511 Age-related nuclear cataract, right eye: Secondary | ICD-10-CM | POA: Diagnosis not present

## 2022-08-07 DIAGNOSIS — F32A Depression, unspecified: Secondary | ICD-10-CM | POA: Diagnosis not present

## 2022-08-07 HISTORY — PX: CATARACT EXTRACTION W/PHACO: SHX586

## 2022-08-07 LAB — GLUCOSE, CAPILLARY: Glucose-Capillary: 139 mg/dL — ABNORMAL HIGH (ref 70–99)

## 2022-08-07 SURGERY — PHACOEMULSIFICATION, CATARACT, WITH IOL INSERTION
Anesthesia: Monitor Anesthesia Care | Site: Eye | Laterality: Right

## 2022-08-07 MED ORDER — PHENYLEPHRINE-KETOROLAC 1-0.3 % IO SOLN
INTRAOCULAR | Status: AC
  Filled 2022-08-07: qty 4

## 2022-08-07 MED ORDER — SODIUM CHLORIDE 0.9% FLUSH
INTRAVENOUS | Status: DC | PRN
  Administered 2022-08-07: 3 mL via INTRAVENOUS

## 2022-08-07 MED ORDER — PHENYLEPHRINE-KETOROLAC 1-0.3 % IO SOLN
INTRAOCULAR | Status: DC | PRN
  Administered 2022-08-07: 500 mL via OPHTHALMIC

## 2022-08-07 MED ORDER — SODIUM HYALURONATE 23MG/ML IO SOSY
PREFILLED_SYRINGE | INTRAOCULAR | Status: DC | PRN
  Administered 2022-08-07: 0.6 mL via INTRAOCULAR

## 2022-08-07 MED ORDER — BSS IO SOLN
INTRAOCULAR | Status: DC | PRN
  Administered 2022-08-07: 15 mL via INTRAOCULAR

## 2022-08-07 MED ORDER — POVIDONE-IODINE 5 % OP SOLN
OPHTHALMIC | Status: DC | PRN
  Administered 2022-08-07: 1 via OPHTHALMIC

## 2022-08-07 MED ORDER — MIDAZOLAM HCL 5 MG/5ML IJ SOLN
INTRAMUSCULAR | Status: DC | PRN
  Administered 2022-08-07: 1 mg via INTRAVENOUS

## 2022-08-07 MED ORDER — FENTANYL CITRATE (PF) 100 MCG/2ML IJ SOLN
INTRAMUSCULAR | Status: DC | PRN
  Administered 2022-08-07: 50 ug via INTRAVENOUS

## 2022-08-07 MED ORDER — SODIUM HYALURONATE 10 MG/ML IO SOLUTION
PREFILLED_SYRINGE | INTRAOCULAR | Status: DC | PRN
  Administered 2022-08-07: 0.85 mL via INTRAOCULAR

## 2022-08-07 MED ORDER — FENTANYL CITRATE (PF) 100 MCG/2ML IJ SOLN
INTRAMUSCULAR | Status: AC
  Filled 2022-08-07: qty 2

## 2022-08-07 MED ORDER — TETRACAINE HCL 0.5 % OP SOLN
1.0000 [drp] | OPHTHALMIC | Status: AC | PRN
  Administered 2022-08-07 (×3): 1 [drp] via OPHTHALMIC

## 2022-08-07 MED ORDER — TROPICAMIDE 1 % OP SOLN
1.0000 [drp] | OPHTHALMIC | Status: AC | PRN
  Administered 2022-08-07 (×3): 1 [drp] via OPHTHALMIC

## 2022-08-07 MED ORDER — LIDOCAINE HCL 3.5 % OP GEL
1.0000 | Freq: Once | OPHTHALMIC | Status: AC
  Administered 2022-08-07: 1 via OPHTHALMIC

## 2022-08-07 MED ORDER — MOXIFLOXACIN HCL 0.5 % OP SOLN
OPHTHALMIC | Status: DC | PRN
  Administered 2022-08-07: 1 [drp] via OPHTHALMIC

## 2022-08-07 MED ORDER — LIDOCAINE HCL (PF) 1 % IJ SOLN
INTRAOCULAR | Status: DC | PRN
  Administered 2022-08-07: 1 mL via OPHTHALMIC

## 2022-08-07 MED ORDER — EPINEPHRINE PF 1 MG/ML IJ SOLN
INTRAMUSCULAR | Status: AC
  Filled 2022-08-07: qty 1

## 2022-08-07 MED ORDER — PHENYLEPHRINE HCL 2.5 % OP SOLN
1.0000 [drp] | OPHTHALMIC | Status: AC | PRN
  Administered 2022-08-07 (×3): 1 [drp] via OPHTHALMIC

## 2022-08-07 MED ORDER — MIDAZOLAM HCL 2 MG/2ML IJ SOLN
INTRAMUSCULAR | Status: AC
  Filled 2022-08-07: qty 2

## 2022-08-07 MED ORDER — STERILE WATER FOR IRRIGATION IR SOLN
Status: DC | PRN
  Administered 2022-08-07: 500 mL

## 2022-08-07 SURGICAL SUPPLY — 16 items
CATARACT SUITE SIGHTPATH (MISCELLANEOUS) ×1 IMPLANT
CLOTH BEACON ORANGE TIMEOUT ST (SAFETY) ×1 IMPLANT
DRAPE HALF SHEET 40X57 (DRAPES) IMPLANT
EYE SHIELD UNIVERSAL CLEAR (GAUZE/BANDAGES/DRESSINGS) IMPLANT
FEE CATARACT SUITE SIGHTPATH (MISCELLANEOUS) ×1 IMPLANT
GLOVE BIOGEL PI IND STRL 7.0 (GLOVE) ×2 IMPLANT
LENS IOL RAYNER 23.0 (Intraocular Lens) ×1 IMPLANT
LENS IOL RAYONE EMV 23.0 (Intraocular Lens) IMPLANT
NDL HYPO 18GX1.5 BLUNT FILL (NEEDLE) ×1 IMPLANT
NEEDLE HYPO 18GX1.5 BLUNT FILL (NEEDLE) ×1 IMPLANT
PAD ARMBOARD 7.5X6 YLW CONV (MISCELLANEOUS) ×1 IMPLANT
RING MALYGIN 7.0 (MISCELLANEOUS) IMPLANT
SYR TB 1ML LL NO SAFETY (SYRINGE) ×1 IMPLANT
TAPE SURG TRANSPORE 1 IN (GAUZE/BANDAGES/DRESSINGS) IMPLANT
TAPE SURGICAL TRANSPORE 1 IN (GAUZE/BANDAGES/DRESSINGS) ×1
WATER STERILE IRR 500ML POUR (IV SOLUTION) IMPLANT

## 2022-08-07 NOTE — Interval H&P Note (Signed)
History and Physical Interval Note:  08/07/2022 9:01 AM  [name]  has presented today for surgery, with the diagnosis of nuclear sclerosis age related cataract; right.  The various methods of treatment have been discussed with the patient and family. After consideration of risks, benefits and other options for treatment, the patient has consented to  Procedure(s) with comments: CATARACT EXTRACTION PHACO AND INTRAOCULAR LENS PLACEMENT (IOC) (Right) - CDE: as a surgical intervention.  The patient's history has been reviewed, patient examined, no change in status, stable for surgery.  I have reviewed the patient's chart and labs.  Questions were answered to the patient's satisfaction.     Baruch Goldmann

## 2022-08-07 NOTE — Discharge Instructions (Addendum)
Please discharge patient when stable, will follow up today with Dr. Wrzosek at the [facility] [place] office immediately following discharge.  Leave shield in place until visit.  All paperwork with discharge instructions will be given at the office.   [facility] Address:  [address]  [place], [place] [address]  

## 2022-08-07 NOTE — Transfer of Care (Signed)
Immediate Anesthesia Transfer of Care Note  Patient: [name]  Procedure(s) Performed: CATARACT EXTRACTION PHACO AND INTRAOCULAR LENS PLACEMENT (IOC) (Right: Eye)  Patient Location: Short Stay  Anesthesia Type:MAC  Level of Consciousness: awake, alert  and oriented  Airway & Oxygen Therapy: Patient Spontanous Breathing  Post-op Assessment: Report given to RN and Post -op Vital signs reviewed and stable  Post vital signs: Reviewed and stable  Last Vitals:  Vitals Value Taken Time  BP 132/74 08/07/22 0931  Temp 36.6 C 08/07/22 0931  Pulse 78 08/07/22 0931  Resp 15 08/07/22 0931  SpO2 95 % 08/07/22 0931    Last Pain:  Vitals:   08/07/22 0931  TempSrc: Oral  PainSc: 0-No pain         Complications: No notable events documented.

## 2022-08-07 NOTE — Anesthesia Postprocedure Evaluation (Signed)
Anesthesia Post Note  Patient: [name]  Procedure(s) Performed: CATARACT EXTRACTION PHACO AND INTRAOCULAR LENS PLACEMENT (IOC) (Right: Eye)  Patient location during evaluation: Phase II Anesthesia Type: MAC Level of consciousness: awake Pain management: pain level controlled Vital Signs Assessment: post-procedure vital signs reviewed and stable Respiratory status: spontaneous breathing and respiratory function stable Cardiovascular status: blood pressure returned to baseline and stable Postop Assessment: no headache and no apparent nausea or vomiting Anesthetic complications: no Comments: Late entry   No notable events documented.   Last Vitals:  Vitals:   08/07/22 0900 08/07/22 0931  BP: (!) 153/91 132/74  Pulse: 66 78  Resp: 18 15  Temp: 36.4 C 36.6 C  SpO2: 96% 95%    Last Pain:  Vitals:   08/07/22 0931  TempSrc: Oral  PainSc: 0-No pain                 Louann Sjogren

## 2022-08-07 NOTE — Op Note (Signed)
Date of procedure: 08/07/22  Pre-operative diagnosis:  Visually significant combined form age-related cataract, Right Eye (H25.811)  Post-operative diagnosis:  Visually significant combined form age-related cataract, Right Eye (H25.811)  Procedure: Removal of cataract via phacoemulsification and insertion of intra-ocular lens Rayner [id number] +23.0D into the capsular bag of the Right Eye  Attending surgeon: Gerda Diss. Diana Armijo, MD, MA  Anesthesia: MAC, Topical Akten  Complications: None  Estimated Blood Loss: <30m (minimal)  Specimens: None  Implants: As above  Indications:  Visually significant age-related cataract, Right Eye  Procedure:  The patient was seen and identified in the pre-operative area. The operative eye was identified and dilated.  The operative eye was marked.  Topical anesthesia was administered to the operative eye.     The patient was then to the operative suite and placed in the supine position.  A timeout was performed confirming the patient, procedure to be performed, and all other relevant information.   The patient's face was prepped and draped in the usual fashion for intra-ocular surgery.  A lid speculum was placed into the operative eye and the surgical microscope moved into place and focused.  A superotemporal paracentesis was created using a 20 gauge paracentesis blade.  Shugarcaine was injected into the anterior chamber.  Viscoelastic was injected into the anterior chamber.  A temporal clear-corneal main wound incision was created using a 2.424mmicrokeratome.  A continuous curvilinear capsulorrhexis was initiated using an irrigating cystitome and completed using capsulorrhexis forceps.  Hydrodissection and hydrodeliniation were performed.  Viscoelastic was injected into the anterior chamber.  A phacoemulsification handpiece and a chopper as a second instrument were used to remove the nucleus and epinucleus. The irrigation/aspiration handpiece was used to remove any  remaining cortical material.   The capsular bag was reinflated with viscoelastic, checked, and found to be intact.  The intraocular lens was inserted into the capsular bag.  The irrigation/aspiration handpiece was used to remove any remaining viscoelastic.  The clear corneal wound and paracentesis wounds were then hydrated and checked with Weck-Cels to be watertight.  The lid-speculum was removed.  The drape was removed.  The patient's face was cleaned with a wet and dry 4x4.   Moxifloxacin drops were administered to the right eye. A clear shield was taped over the eye. The patient was taken to the post-operative care unit in good condition, having tolerated the procedure well.  Post-Op Instructions: [facility] and will receive all other orders and instructions.

## 2022-08-07 NOTE — Anesthesia Preprocedure Evaluation (Signed)
Anesthesia Evaluation  Patient identified by MRN, date of birth, ID band Patient awake    Reviewed: Allergy & Precautions, H&P , NPO status , Patient's Chart, lab work & pertinent test results, reviewed documented beta blocker date and time   Airway Mallampati: II  TM Distance: >3 FB Neck ROM: full    Dental no notable dental hx.    Pulmonary neg pulmonary ROS,    Pulmonary exam normal breath sounds clear to auscultation       Cardiovascular Exercise Tolerance: Good hypertension, + CAD and + Peripheral Vascular Disease   Rhythm:regular Rate:Normal     Neuro/Psych PSYCHIATRIC DISORDERS Anxiety Depression negative neurological ROS     GI/Hepatic negative GI ROS, Neg liver ROS,   Endo/Other  negative endocrine ROSdiabetes, Type 2  Renal/GU negative Renal ROS  negative genitourinary   Musculoskeletal   Abdominal   Peds  Hematology negative hematology ROS (+)   Anesthesia Other Findings   Reproductive/Obstetrics negative OB ROS                             Anesthesia Physical Anesthesia Plan  ASA: 3  Anesthesia Plan: MAC   Post-op Pain Management:    Induction:   PONV Risk Score and Plan:   Airway Management Planned:   Additional Equipment:   Intra-op Plan:   Post-operative Plan:   Informed Consent: I have reviewed the patients History and Physical, chart, labs and discussed the procedure including the risks, benefits and alternatives for the proposed anesthesia with the patient or authorized representative who has indicated his/her understanding and acceptance.     Dental Advisory Given  Plan Discussed with: CRNA  Anesthesia Plan Comments:         Anesthesia Quick Evaluation

## 2022-08-08 ENCOUNTER — Encounter (HOSPITAL_COMMUNITY): Payer: Self-pay | Admitting: Ophthalmology

## 2022-08-14 ENCOUNTER — Encounter (HOSPITAL_COMMUNITY)
Admission: RE | Admit: 2022-08-14 | Discharge: 2022-08-14 | Disposition: A | Discharge status: Home / Self Care | Payer: Medicare Other | Source: Ambulatory Visit | Attending: Ophthalmology | Admitting: Ophthalmology

## 2022-08-14 DIAGNOSIS — H25812 Combined forms of age-related cataract, left eye: Secondary | ICD-10-CM | POA: Diagnosis not present

## 2022-08-15 NOTE — H&P (Signed)
Surgical History & Physical  Patient Name: [name] DOB: 04-14-1951  Surgery: Cataract extraction with intraocular lens implant phacoemulsification; Left Eye  Surgeon: Baruch Goldmann MD Surgery Date:  08-21-22 Pre-Op Date:  08-14-22  HPI: A 87 Yr. old female patient 1. The patient is returning after cataract post-op. The right eye is affected. Status post cataract post-op, which began 1 week ago: Since the last visit, the affected area is doing well. The patient's vision is stable. Patient is following medication instructions. The patient's left vision is blurry. The complaint is associated with reading small print on medicine bottles/labels, driving at night due to halos/glare, and reading captions on TV. This is negatively affecting the patient's quality of life and the patient is unable to function adequately in life with the current level of vision. HPI was performed by Baruch Goldmann .  Medical History: Macula Degeneration Glaucoma Cataracts HT Retinopathy, Pinguecula, Reticular retinal deg, retinal drusen Diabetes High Blood Pressure  Review of Systems Negative Allergic/Immunologic Negative Cardiovascular Negative Constitutional Negative Ear, Nose, Mouth & Throat Negative Endocrine Negative Eyes Negative Gastrointestinal Negative Genitourinary Negative Hemotologic/Lymphatic Negative Integumentary Negative Musculoskeletal Negative Neurological Negative Psychiatry Negative Respiratory  Social   Never smoked   Medication Prednisolone-Moxifloxacin-Bromfenac,  Alprazolam, Amlodipine, Aspirin, Cambral, Carvedilol, Fexofenadine, Effexor, Vytorin, Plavix, Metformin, HCTZ,   Sx/Procedures Phaco c IOL OD,   Drug Allergies  Penicilin,   History & Physical: Heent: Cataract, left eye NECK: supple without bruits LUNGS: lungs clear to auscultation CV: regular rate and rhythm Abdomen: soft and non-tender Impression & Plan: Assessment: 1.  CATARACT EXTRACTION STATUS;  Right Eye (Z98.41) 2.  COMBINED FORMS AGE RELATED CATARACT; Left Eye (H25.812)  Plan: 1.  Continue Pred-Moxi-Brom 2x/day for 3 more weeks. 1 week after cataract surgery. Doing well with improved vision and normal eye pressure. Call with any problems or concerns.  2.  Cataract accounts for the patient's decreased vision. This visual impairment is not correctable with a tolerable change in glasses or contact lenses. Cataract surgery with an implantation of a new lens should significantly improve the visual and functional status of the patient. Discussed all risks, benefits, alternatives, and potential complications. Discussed the procedures and recovery. Patient desires to have surgery. A-scan ordered and performed today for intra-ocular lens calculations. The surgery will be performed in order to improve vision for driving, reading, and for eye examinations. Recommend phacoemulsification with intra-ocular lens. Recommend Dextenza for post-operative pain and inflammation. Left Eye. Surgery required to correct imbalance of vision. Dilates poorly - shugarcaine by protocol. Malyugin Ring. Omidira.

## 2022-08-21 ENCOUNTER — Ambulatory Visit (HOSPITAL_COMMUNITY): Payer: Medicare Other | Admitting: Anesthesiology

## 2022-08-21 ENCOUNTER — Ambulatory Visit (HOSPITAL_BASED_OUTPATIENT_CLINIC_OR_DEPARTMENT_OTHER): Payer: Medicare Other | Admitting: Anesthesiology

## 2022-08-21 ENCOUNTER — Ambulatory Visit (HOSPITAL_COMMUNITY)
Admission: RE | Admit: 2022-08-21 | Discharge: 2022-08-21 | Disposition: A | Discharge status: Home / Self Care | Payer: Medicare Other | Attending: Ophthalmology | Admitting: Ophthalmology

## 2022-08-21 ENCOUNTER — Encounter (HOSPITAL_COMMUNITY): Payer: Self-pay | Admitting: Ophthalmology

## 2022-08-21 ENCOUNTER — Encounter (HOSPITAL_COMMUNITY)
Admission: RE | Disposition: A | Discharge status: Home / Self Care | Payer: Self-pay | Source: Home / Self Care | Attending: Ophthalmology

## 2022-08-21 DIAGNOSIS — E1151 Type 2 diabetes mellitus with diabetic peripheral angiopathy without gangrene: Secondary | ICD-10-CM | POA: Diagnosis not present

## 2022-08-21 DIAGNOSIS — Z955 Presence of coronary angioplasty implant and graft: Secondary | ICD-10-CM | POA: Insufficient documentation

## 2022-08-21 DIAGNOSIS — H2512 Age-related nuclear cataract, left eye: Secondary | ICD-10-CM | POA: Diagnosis not present

## 2022-08-21 DIAGNOSIS — I10 Essential (primary) hypertension: Secondary | ICD-10-CM | POA: Diagnosis not present

## 2022-08-21 DIAGNOSIS — F418 Other specified anxiety disorders: Secondary | ICD-10-CM | POA: Insufficient documentation

## 2022-08-21 DIAGNOSIS — Z7984 Long term (current) use of oral hypoglycemic drugs: Secondary | ICD-10-CM | POA: Insufficient documentation

## 2022-08-21 DIAGNOSIS — I252 Old myocardial infarction: Secondary | ICD-10-CM

## 2022-08-21 DIAGNOSIS — E1136 Type 2 diabetes mellitus with diabetic cataract: Secondary | ICD-10-CM | POA: Diagnosis not present

## 2022-08-21 DIAGNOSIS — H25812 Combined forms of age-related cataract, left eye: Secondary | ICD-10-CM | POA: Insufficient documentation

## 2022-08-21 DIAGNOSIS — I2510 Atherosclerotic heart disease of native coronary artery without angina pectoris: Secondary | ICD-10-CM

## 2022-08-21 DIAGNOSIS — I739 Peripheral vascular disease, unspecified: Secondary | ICD-10-CM | POA: Diagnosis not present

## 2022-08-21 HISTORY — PX: CATARACT EXTRACTION W/PHACO: SHX586

## 2022-08-21 LAB — GLUCOSE, CAPILLARY: Glucose-Capillary: 142 mg/dL — ABNORMAL HIGH (ref 70–99)

## 2022-08-21 SURGERY — PHACOEMULSIFICATION, CATARACT, WITH IOL INSERTION
Anesthesia: Monitor Anesthesia Care | Site: Eye | Laterality: Left

## 2022-08-21 MED ORDER — POVIDONE-IODINE 5 % OP SOLN
OPHTHALMIC | Status: DC | PRN
  Administered 2022-08-21: 1 via OPHTHALMIC

## 2022-08-21 MED ORDER — EPINEPHRINE PF 1 MG/ML IJ SOLN
INTRAMUSCULAR | Status: AC
  Filled 2022-08-21: qty 1

## 2022-08-21 MED ORDER — SODIUM HYALURONATE 23MG/ML IO SOSY
PREFILLED_SYRINGE | INTRAOCULAR | Status: DC | PRN
  Administered 2022-08-21: 0.6 mL via INTRAOCULAR

## 2022-08-21 MED ORDER — PHENYLEPHRINE HCL 2.5 % OP SOLN
1.0000 [drp] | OPHTHALMIC | Status: AC | PRN
  Administered 2022-08-21 (×3): 1 [drp] via OPHTHALMIC

## 2022-08-21 MED ORDER — EPINEPHRINE PF 1 MG/ML IJ SOLN
INTRAOCULAR | Status: DC | PRN
  Administered 2022-08-21: 500 mL

## 2022-08-21 MED ORDER — LIDOCAINE HCL (PF) 1 % IJ SOLN
INTRAOCULAR | Status: DC | PRN
  Administered 2022-08-21: 1 mL via OPHTHALMIC

## 2022-08-21 MED ORDER — BSS IO SOLN
INTRAOCULAR | Status: DC | PRN
  Administered 2022-08-21: 15 mL via INTRAOCULAR

## 2022-08-21 MED ORDER — LIDOCAINE HCL 3.5 % OP GEL
1.0000 | Freq: Once | OPHTHALMIC | Status: AC
  Administered 2022-08-21: 1 via OPHTHALMIC

## 2022-08-21 MED ORDER — TROPICAMIDE 1 % OP SOLN
1.0000 [drp] | OPHTHALMIC | Status: AC | PRN
  Administered 2022-08-21 (×3): 1 [drp] via OPHTHALMIC

## 2022-08-21 MED ORDER — STERILE WATER FOR IRRIGATION IR SOLN
Status: DC | PRN
  Administered 2022-08-21: 50 mL

## 2022-08-21 MED ORDER — SODIUM HYALURONATE 10 MG/ML IO SOLUTION
PREFILLED_SYRINGE | INTRAOCULAR | Status: DC | PRN
  Administered 2022-08-21: 0.85 mL via INTRAOCULAR

## 2022-08-21 MED ORDER — FENTANYL CITRATE (PF) 100 MCG/2ML IJ SOLN
INTRAMUSCULAR | Status: DC | PRN
  Administered 2022-08-21: 100 ug via INTRAVENOUS

## 2022-08-21 MED ORDER — FENTANYL CITRATE (PF) 100 MCG/2ML IJ SOLN
INTRAMUSCULAR | Status: AC
  Filled 2022-08-21: qty 2

## 2022-08-21 MED ORDER — MOXIFLOXACIN HCL 0.5 % OP SOLN
OPHTHALMIC | Status: DC | PRN
  Administered 2022-08-21: 2 [drp] via OPHTHALMIC

## 2022-08-21 MED ORDER — MIDAZOLAM HCL 2 MG/2ML IJ SOLN
INTRAMUSCULAR | Status: AC
  Filled 2022-08-21: qty 2

## 2022-08-21 MED ORDER — PHENYLEPHRINE-KETOROLAC 1-0.3 % IO SOLN
INTRAOCULAR | Status: AC
  Filled 2022-08-21: qty 4

## 2022-08-21 MED ORDER — MIDAZOLAM HCL 5 MG/5ML IJ SOLN
INTRAMUSCULAR | Status: DC | PRN
  Administered 2022-08-21: 2 mg via INTRAVENOUS

## 2022-08-21 MED ORDER — TETRACAINE HCL 0.5 % OP SOLN
1.0000 [drp] | OPHTHALMIC | Status: AC | PRN
  Administered 2022-08-21 (×3): 1 [drp] via OPHTHALMIC

## 2022-08-21 SURGICAL SUPPLY — 16 items
CATARACT SUITE SIGHTPATH (MISCELLANEOUS) ×1 IMPLANT
CLOTH BEACON ORANGE TIMEOUT ST (SAFETY) ×1 IMPLANT
EYE SHIELD UNIVERSAL CLEAR (GAUZE/BANDAGES/DRESSINGS) IMPLANT
FEE CATARACT SUITE SIGHTPATH (MISCELLANEOUS) ×1 IMPLANT
GLOVE BIOGEL PI IND STRL 7.0 (GLOVE) ×2 IMPLANT
GLOVE SS BIOGEL STRL SZ 6.5 (GLOVE) IMPLANT
GOWN SRG XL 47XLVL 4 REINF (GOWN DISPOSABLE) IMPLANT
GOWN STRL NON-REIN XL LVL4 (GOWN DISPOSABLE) ×1
LENS IOL RAYNER 21.0 (Intraocular Lens) ×1 IMPLANT
LENS IOL RAYONE EMV 21.0 (Intraocular Lens) IMPLANT
NDL HYPO 18GX1.5 BLUNT FILL (NEEDLE) ×1 IMPLANT
NEEDLE HYPO 18GX1.5 BLUNT FILL (NEEDLE) ×1 IMPLANT
PAD ARMBOARD 7.5X6 YLW CONV (MISCELLANEOUS) ×1 IMPLANT
SYR TB 1ML LL NO SAFETY (SYRINGE) ×1 IMPLANT
TAPE SURG TRANSPORE 1 IN (GAUZE/BANDAGES/DRESSINGS) IMPLANT
TAPE SURGICAL TRANSPORE 1 IN (GAUZE/BANDAGES/DRESSINGS) ×1

## 2022-08-21 NOTE — Discharge Instructions (Addendum)
Please discharge patient when stable, will follow up today with Dr. Wrzosek at the [facility] [place] office immediately following discharge.  Leave shield in place until visit.  All paperwork with discharge instructions will be given at the office.  [facility] Address:  [address]  , [place] [address]   PATIENT INSTRUCTIONS POST-ANESTHESIA  IMMEDIATELY FOLLOWING SURGERY:  Do not drive or operate machinery for the first twenty four hours after surgery.  Do not make any important decisions for twenty four hours after surgery or while taking narcotic pain medications or sedatives.  If you develop intractable nausea and vomiting or a severe headache please notify your doctor immediately.  FOLLOW-UP:  Please make an appointment with your surgeon as instructed. You do not need to follow up with anesthesia unless specifically instructed to do so.  WOUND CARE INSTRUCTIONS (if applicable):  Keep a dry clean dressing on the anesthesia/puncture wound site if there is drainage.  Once the wound has quit draining you may leave it open to air.  Generally you should leave the bandage intact for twenty four hours unless there is drainage.  If the epidural site drains for more than 36-48 hours please call the anesthesia department.  QUESTIONS?:  Please feel free to call your physician or the hospital operator if you have any questions, and they will be happy to assist you.       

## 2022-08-21 NOTE — Interval H&P Note (Signed)
History and Physical Interval Note:  08/21/2022 12:10 PM  [name]  has presented today for surgery, with the diagnosis of combined forms age related cataract; left.  The various methods of treatment have been discussed with the patient and family. After consideration of risks, benefits and other options for treatment, the patient has consented to  Procedure(s) with comments: CATARACT EXTRACTION PHACO AND INTRAOCULAR LENS PLACEMENT (IOC) (Left) - CDE:  as a surgical intervention.  The patient's history has been reviewed, patient examined, no change in status, stable for surgery.  I have reviewed the patient's chart and labs.  Questions were answered to the patient's satisfaction.     Baruch Goldmann

## 2022-08-21 NOTE — Transfer of Care (Signed)
Immediate Anesthesia Transfer of Care Note  Patient: [name]  Procedure(s) Performed: CATARACT EXTRACTION PHACO AND INTRAOCULAR LENS PLACEMENT (IOC) (Left: Eye)  Patient Location: PACU and Short Stay  Anesthesia Type:MAC  Level of Consciousness: awake  Airway & Oxygen Therapy: Patient Spontanous Breathing  Post-op Assessment: Report given to RN  Post vital signs: Reviewed  Last Vitals:  Vitals Value Taken Time  BP 123/70 08/21/22 1309  Temp 36.5 C 08/21/22 1309  Pulse 73 08/21/22 1309  Resp 21 08/21/22 1309  SpO2 96 % 08/21/22 1309    Last Pain:  Vitals:   08/21/22 1309  TempSrc: Oral  PainSc: 0-No pain         Complications: There were no known notable events for this encounter.

## 2022-08-21 NOTE — Op Note (Signed)
Date of procedure: 08/21/22  Pre-operative diagnosis: Visually significant age-related combined cataract, Left Eye (H25.812)  Post-operative diagnosis: Visually significant age-related combined cataract, Left Eye (H25.812)  Procedure: Removal of cataract via phacoemulsification and insertion of intra-ocular lens Rayner [id number] +21.0D into the capsular bag of the Left Eye  Attending surgeon: Gerda Diss. Sudais Banghart, MD, MA  Anesthesia: MAC, Topical Akten  Complications: None  Estimated Blood Loss: <15m (minimal)  Specimens: None  Implants: As above  Indications:  Visually significant age-related cataract, Left Eye  Procedure:  The patient was seen and identified in the pre-operative area. The operative eye was identified and dilated.  The operative eye was marked.  Topical anesthesia was administered to the operative eye.     The patient was then to the operative suite and placed in the supine position.  A timeout was performed confirming the patient, procedure to be performed, and all other relevant information.   The patient's face was prepped and draped in the usual fashion for intra-ocular surgery.  A lid speculum was placed into the operative eye and the surgical microscope moved into place and focused.  An inferotemporal paracentesis was created using a 20 gauge paracentesis blade.  Shugarcaine was injected into the anterior chamber.  Viscoelastic was injected into the anterior chamber.  A temporal clear-corneal main wound incision was created using a 2.471mmicrokeratome.  A continuous curvilinear capsulorrhexis was initiated using an irrigating cystitome and completed using capsulorrhexis forceps.  Hydrodissection and hydrodeliniation were performed.  Viscoelastic was injected into the anterior chamber.  A phacoemulsification handpiece and a chopper as a second instrument were used to remove the nucleus and epinucleus. The irrigation/aspiration handpiece was used to remove any remaining  cortical material.   The capsular bag was reinflated with viscoelastic, checked, and found to be intact.  The intraocular lens was inserted into the capsular bag.  The irrigation/aspiration handpiece was used to remove any remaining viscoelastic.  The clear corneal wound and paracentesis wounds were then hydrated and checked with Weck-Cels to be watertight.  Moxifloxacin drops were instilled on the eye. The lid-speculum was removed.  The drape was removed.  The patient's face was cleaned with a wet and dry 4x4.    A clear shield was taped over the eye. The patient was taken to the post-operative care unit in good condition, having tolerated the procedure well.  Post-Op Instructions: [facility] and will receive all other orders and instructions.

## 2022-08-21 NOTE — Anesthesia Preprocedure Evaluation (Signed)
Anesthesia Evaluation  Patient identified by MRN, date of birth, ID band Patient awake    Reviewed: Allergy & Precautions, NPO status , Patient's Chart, lab work & pertinent test results, reviewed documented beta blocker date and time   Airway Mallampati: II  TM Distance: >3 FB Neck ROM: Full    Dental  (+) Dental Advisory Given, Caps   Pulmonary neg pulmonary ROS,    Pulmonary exam normal breath sounds clear to auscultation       Cardiovascular Exercise Tolerance: Good hypertension, Pt. on medications and Pt. on home beta blockers + CAD, + Past MI, + Cardiac Stents and + Peripheral Vascular Disease (RAS, Carotid bruit)  Normal cardiovascular exam Rhythm:Regular Rate:Normal     Neuro/Psych PSYCHIATRIC DISORDERS Anxiety Depression CVA    GI/Hepatic negative GI ROS, (+)     substance abuse (occasional beer)  alcohol use,   Endo/Other  diabetes, Well Controlled, Type 2, Oral Hypoglycemic Agents  Renal/GU negative Renal ROS  negative genitourinary   Musculoskeletal negative musculoskeletal ROS (+)   Abdominal   Peds negative pediatric ROS (+)  Hematology negative hematology ROS (+)   Anesthesia Other Findings   Reproductive/Obstetrics negative OB ROS                           Anesthesia Physical Anesthesia Plan  ASA: 3  Anesthesia Plan: MAC   Post-op Pain Management: Minimal or no pain anticipated   Induction:   PONV Risk Score and Plan:   Airway Management Planned: Nasal Cannula and Natural Airway  Additional Equipment:   Intra-op Plan:   Post-operative Plan:   Informed Consent: I have reviewed the patients History and Physical, chart, labs and discussed the procedure including the risks, benefits and alternatives for the proposed anesthesia with the patient or authorized representative who has indicated his/her understanding and acceptance.       Plan Discussed with:  CRNA and Surgeon  Anesthesia Plan Comments:         Anesthesia Quick Evaluation

## 2022-08-21 NOTE — Anesthesia Postprocedure Evaluation (Signed)
Anesthesia Post Note  Patient: [name]  Procedure(s) Performed: CATARACT EXTRACTION PHACO AND INTRAOCULAR LENS PLACEMENT (IOC) (Left: Eye)  Patient location during evaluation: Phase II Anesthesia Type: MAC Level of consciousness: awake and alert and oriented Pain management: pain level controlled Vital Signs Assessment: post-procedure vital signs reviewed and stable Respiratory status: spontaneous breathing, nonlabored ventilation and respiratory function stable Cardiovascular status: blood pressure returned to baseline and stable Postop Assessment: no apparent nausea or vomiting Anesthetic complications: no   There were no known notable events for this encounter.   Last Vitals:  Vitals:   08/21/22 1120 08/21/22 1309  BP: (!) 146/79 123/70  Pulse: 69 73  Resp: (!) 24 (!) 21  Temp: (!) 36.4 C 36.5 C  SpO2: 96% 96%    Last Pain:  Vitals:   08/21/22 1309  TempSrc: Oral  PainSc: 0-No pain                 Serjio Deupree C Basha Krygier

## 2022-08-23 ENCOUNTER — Encounter (HOSPITAL_COMMUNITY): Payer: Self-pay | Admitting: Ophthalmology

## 2022-08-28 DIAGNOSIS — H2512 Age-related nuclear cataract, left eye: Secondary | ICD-10-CM | POA: Diagnosis not present

## 2022-09-04 DIAGNOSIS — Z23 Encounter for immunization: Secondary | ICD-10-CM | POA: Diagnosis not present

## 2022-09-18 DIAGNOSIS — E7800 Pure hypercholesterolemia, unspecified: Secondary | ICD-10-CM | POA: Diagnosis not present

## 2022-09-18 DIAGNOSIS — Z1339 Encounter for screening examination for other mental health and behavioral disorders: Secondary | ICD-10-CM | POA: Diagnosis not present

## 2022-09-18 DIAGNOSIS — Z299 Encounter for prophylactic measures, unspecified: Secondary | ICD-10-CM | POA: Diagnosis not present

## 2022-09-18 DIAGNOSIS — R5383 Other fatigue: Secondary | ICD-10-CM | POA: Diagnosis not present

## 2022-09-18 DIAGNOSIS — I10 Essential (primary) hypertension: Secondary | ICD-10-CM | POA: Diagnosis not present

## 2022-09-18 DIAGNOSIS — Z7189 Other specified counseling: Secondary | ICD-10-CM | POA: Diagnosis not present

## 2022-09-18 DIAGNOSIS — Z6829 Body mass index (BMI) 29.0-29.9, adult: Secondary | ICD-10-CM | POA: Diagnosis not present

## 2022-09-18 DIAGNOSIS — Z79899 Other long term (current) drug therapy: Secondary | ICD-10-CM | POA: Diagnosis not present

## 2022-09-18 DIAGNOSIS — Z1331 Encounter for screening for depression: Secondary | ICD-10-CM | POA: Diagnosis not present

## 2022-09-18 DIAGNOSIS — Z0000 Encounter for general adult medical examination without abnormal findings: Secondary | ICD-10-CM | POA: Diagnosis not present

## 2022-09-20 ENCOUNTER — Other Ambulatory Visit: Payer: Self-pay | Admitting: Internal Medicine

## 2022-09-20 DIAGNOSIS — R5383 Other fatigue: Secondary | ICD-10-CM | POA: Diagnosis not present

## 2022-09-20 DIAGNOSIS — E7800 Pure hypercholesterolemia, unspecified: Secondary | ICD-10-CM | POA: Diagnosis not present

## 2022-09-20 DIAGNOSIS — Z1231 Encounter for screening mammogram for malignant neoplasm of breast: Secondary | ICD-10-CM

## 2022-09-20 DIAGNOSIS — Z79899 Other long term (current) drug therapy: Secondary | ICD-10-CM | POA: Diagnosis not present

## 2022-10-09 DIAGNOSIS — Z299 Encounter for prophylactic measures, unspecified: Secondary | ICD-10-CM | POA: Diagnosis not present

## 2022-10-09 DIAGNOSIS — I10 Essential (primary) hypertension: Secondary | ICD-10-CM | POA: Diagnosis not present

## 2022-10-09 DIAGNOSIS — E1165 Type 2 diabetes mellitus with hyperglycemia: Secondary | ICD-10-CM | POA: Diagnosis not present

## 2022-10-23 ENCOUNTER — Ambulatory Visit
Admission: RE | Admit: 2022-10-23 | Discharge: 2022-10-23 | Disposition: A | Discharge status: Home / Self Care | Payer: Medicare Other | Source: Ambulatory Visit | Attending: Internal Medicine | Admitting: Internal Medicine

## 2022-10-23 DIAGNOSIS — Z1231 Encounter for screening mammogram for malignant neoplasm of breast: Secondary | ICD-10-CM | POA: Diagnosis not present

## 2022-11-09 ENCOUNTER — Encounter: Payer: Self-pay | Admitting: Cardiology

## 2022-11-09 ENCOUNTER — Ambulatory Visit: Payer: Medicare Other | Attending: Cardiology | Admitting: Cardiology

## 2022-11-09 VITALS — BP 122/60 | HR 76 | Ht 59.0 in | Wt 141.8 lb

## 2022-11-09 DIAGNOSIS — I2510 Atherosclerotic heart disease of native coronary artery without angina pectoris: Secondary | ICD-10-CM | POA: Diagnosis not present

## 2022-11-09 DIAGNOSIS — I10 Essential (primary) hypertension: Secondary | ICD-10-CM | POA: Insufficient documentation

## 2022-11-09 DIAGNOSIS — I6523 Occlusion and stenosis of bilateral carotid arteries: Secondary | ICD-10-CM | POA: Diagnosis not present

## 2022-11-09 DIAGNOSIS — I6529 Occlusion and stenosis of unspecified carotid artery: Secondary | ICD-10-CM | POA: Insufficient documentation

## 2022-11-09 DIAGNOSIS — E782 Mixed hyperlipidemia: Secondary | ICD-10-CM | POA: Diagnosis not present

## 2022-11-09 NOTE — Patient Instructions (Addendum)
Medication Instructions:  Your physician recommends that you continue on your current medications as directed. Please refer to the Current Medication list given to you today.  Labwork: none  Testing/Procedures: Your physician has requested that you have a carotid duplex. This test is an ultrasound of the carotid arteries in your neck. It looks at blood flow through these arteries that supply the brain with blood. Allow one hour for this exam. There are no restrictions or special instructions.  Follow-Up: Your physician recommends that you schedule a follow-up appointment in: 1 year. You will receive a reminder call in the mail in about 10 months reminding you to call and schedule your appointment. If you don't receive this call, please contact our office.  Any Other Special Instructions Will Be Listed Below (If Applicable).  If you need a refill on your cardiac medications before your next appointment, please call your pharmacy. 

## 2022-11-09 NOTE — Progress Notes (Signed)
Clinical Summary Ms. Skyles is a 71 y.o.female seen today for follow up of the following medical problems.    1. CAD - hx of NSTEMI in 2008, received a BMS to the LCX.   - 04/2014 echo: LVEF 60-65%     - no chest pains, no SOB/DOE - compliant with meds     2. PAD - mild left renal artery stenosis, bp controlled with no reported history of renal disease.   - severe ostial SMA stenosis, denies any stomach pain.   - mild to moderate bilateral carotid stenosis    -due for repeat carotid US    3. HTN - she is compliantw ith meds     4. Hyperlipidemia - she is compliant with statin.    - 09/2022 TC 142 TG 131 HDL 51 LDL 68        Past Medical History:  Diagnosis Date   Anxiety and depression    CAD (coronary artery disease)    NSTEMI/BMS pCFX, 01/08   Colitis    CVD (cerebrovascular disease)    non obstructive   Diabetes mellitus without complication (HCC)    Dyslipidemia    HTN (hypertension)    Myocardial infarction (HCC)    PAD (peripheral artery disease) (HCC)    mild left renal artery stenosis; severe osital SMA stenosis     Allergies  Allergen Reactions   Penicillins     REACTION: ?rash     Current Outpatient Medications  Medication Sig Dispense Refill   ALPRAZolam (XANAX) 0.25 MG tablet Take 0.25 mg by mouth 2 (two) times daily as needed.       amLODipine (NORVASC) 10 MG tablet Take 1 tablet (10 mg total) by mouth daily. 90 tablet 2   aspirin 81 MG EC tablet Take 81 mg by mouth daily.       atorvastatin (LIPITOR) 80 MG tablet TAKE ONE TABLET BY MOUTH DAILY. 90 tablet 1   carvedilol (COREG) 12.5 MG tablet TAKE ONE TABLET BY MOUTH TWICE DAILY 180 tablet 1   Cholecalciferol (VITAMIN D3) 2000 UNITS capsule Take 2,000 Units by mouth daily.     CINNAMON PO Take 1,000 mg by mouth 2 (two) times daily.     fluticasone (FLONASE) 50 MCG/ACT nasal spray Place 1 spray into both nostrils daily as needed for allergies or rhinitis.       hydrochlorothiazide (HYDRODIURIL) 25 MG tablet TAKE ONE TABLET BY MOUTH DAILY 90 tablet 2   isosorbide mononitrate (IMDUR) 30 MG 24 hr tablet TAKE ONE TABLET BY MOUTH DAILY 90 tablet 2   magnesium oxide (MAG-OX) 400 MG tablet Take 400 mg by mouth 2 (two) times daily.     Multiple Vitamins-Minerals (ONE-A-DAY EXTRAS ANTIOXIDANT) CAPS Take 1 capsule by mouth daily.       Multiple Vitamins-Minerals (PRESERVISION/LUTEIN) CAPS Take 1 capsule by mouth 2 (two) times daily.       nitroGLYCERIN (NITROSTAT) 0.4 MG SL tablet Place 1 tablet (0.4 mg total) under the tongue every 5 (five) minutes as needed. 25 tablet 3   Omega-3 Fatty Acids (FISH OIL) 1200 MG CAPS Take 1 capsule by mouth 2 (two) times a day.      potassium chloride SA (KLOR-CON M) 20 MEQ tablet TAKE 1 AND 1/2 TABLETS BY MOUTH TWICE DAILY 270 tablet 1   venlafaxine (EFFEXOR-XR) 75 MG 24 hr capsule Take 225 mg by mouth daily.       No current facility-administered medications for this visit.  Past Surgical History:  Procedure Laterality Date   ABDOMINAL HYSTERECTOMY     CATARACT EXTRACTION W/PHACO Right 08/07/2022   Procedure: CATARACT EXTRACTION PHACO AND INTRAOCULAR LENS PLACEMENT (IOC);  Surgeon: Baruch Goldmann, MD;  Location: [facility];  Service: Ophthalmology;  Laterality: Right;  CDE:7.10   CATARACT EXTRACTION W/PHACO Left 08/21/2022   Procedure: CATARACT EXTRACTION PHACO AND INTRAOCULAR LENS PLACEMENT (IOC);  Surgeon: Baruch Goldmann, MD;  Location: [facility];  Service: Ophthalmology;  Laterality: Left;  CDE: 9.77   CORONARY ANGIOPLASTY WITH STENT PLACEMENT  2008   BMS x 2 CFX     Allergies  Allergen Reactions   Penicillins     REACTION: ?rash      Family History  Problem Relation Age of Onset   Cirrhosis Mother    COPD Father    Asthma Maternal Grandmother    Diabetes Neg Hx    Hypertension Neg Hx    Coronary artery disease Neg Hx    Breast cancer Neg Hx      Social History Ms. Hilbun reports that she has never  smoked. She has never used smokeless tobacco. Ms. Terhaar reports that she does not currently use alcohol.   Review of Systems CONSTITUTIONAL: No weight loss, fever, chills, weakness or fatigue.  HEENT: Eyes: No visual loss, blurred vision, double vision or yellow sclerae.No hearing loss, sneezing, congestion, runny nose or sore throat.  SKIN: No rash or itching.  CARDIOVASCULAR: per hpi RESPIRATORY: No shortness of breath, cough or sputum.  GASTROINTESTINAL: No anorexia, nausea, vomiting or diarrhea. No abdominal pain or blood.  GENITOURINARY: No burning on urination, no polyuria NEUROLOGICAL: No headache, dizziness, syncope, paralysis, ataxia, numbness or tingling in the extremities. No change in bowel or bladder control.  MUSCULOSKELETAL: No muscle, back pain, joint pain or stiffness.  LYMPHATICS: No enlarged nodes. No history of splenectomy.  PSYCHIATRIC: No history of depression or anxiety.  ENDOCRINOLOGIC: No reports of sweating, cold or heat intolerance. No polyuria or polydipsia.  Marland Kitchen   Physical Examination Today's Vitals   11/09/22 1529  BP: 122/60  Pulse: 76  SpO2: 96%  Weight: 141 lb 12.8 oz (64.3 kg)  Height: '4\' 11"'$  (1.499 m)   Body mass index is 28.64 kg/m.  Gen: resting comfortably, no acute distress HEENT: no scleral icterus, pupils equal round and reactive, no palptable cervical adenopathy,  CV: RRR, 2/6 systolic murmur rusb, no jvd. Right sided carotid bruit Resp: Clear to auscultation bilaterally GI: abdomen is soft, non-tender, non-distended, normal bowel sounds, no hepatosplenomegaly MSK: extremities are warm, no edema.  Skin: warm, no rash Neuro:  no focal deficits Psych: appropriate affect   Diagnostic Studies 11/2006 Cath FINDINGS:   1. LV: 133/13/35.   2. No aortic stenosis on pullback.   3. Left ventriculography: This was deferred due to markedly elevated   left ventricular end diastolic pressure.   4. Left main: There is no left main.   5.  LAD: A fairly large vessel which wraps the apex of the heart. It   gives rise to a moderate sized diagonal. The proximal LAD is   heavily calcified. There is diffuse 30% stenosis. There is then a   focal 50% stenosis just after the origin of the diagonal.   6. Circumflex: Fairly large codominant vessel. There was a 99%   stenosis proximally and a 60% stenosis just after the marginal   which were stented using two overlapping bare metal stents. Flow   improved from TIMI II to TIMI III.  7. RCA: Relatively small, codominant vessel. There is a 30% stenosis   in the mid vessel just after the origin of acute marginal. The   acute marginal has a 90% ostial stenosis. It is a fairly small   vessel.   RECOMMENDATIONS: Successful percutaneous revascularization of the   proximal circumflex. A small non flow limiting proximal edge dissection   required an additional overlapping stent. There was an excellent final   result. Her left ventricular end diastolic pressure is markedly   elevated. I, therefore, did not perform ventriculography. We will plan   on continuation of beta blocker in addition to ACE inhibitor. We will   continue aspirin indefinitely. Plavix should be continued for a minimum   of 30 days and preferably nine months given the acute presentation. She   will need echocardiogram to assess left ventricular systolic function.   12/2006 Cath HEMODYNAMIC RESULTS: Aorta 143/74 mmHg, left ventricle 140/28 mmHg.   ANGIOGRAPHIC FINDINGS:   1. There is a very short left main, essentially nearby separate ostia   of the left anterior descending and circumflex coronary arteries.   2. The left anterior descending is a medium to small caliber vessel   with distal tapering and 30% midvessel stenosis. There is   essentially one medium-sized branching diagonal. No flow-limiting   stenoses are noted.   3. The circumflex coronary artery is a medium to large caliber vessel   that is codominant with a  small right coronary artery. Stent site   is visualized from the proximal portion of the vessel and was   widely patent. Otherwise there are minor luminal irregularities.   There is a ramus intermedius and a large multi-branched obtuse   marginal in the midvessel segment.   4. The right coronary artery is small and codominant. There is an   acute marginal visualized with approximately 80% ostial stenosis   which is not a new finding. Otherwise minor luminal irregularities   are noted.   5. Left ventriculography is performed in the RAO projection and   reveals an ejection fraction approximately 75% with no focal   anterior or inferior wall motion abnormality and trace mitral   regurgitation in the setting of ventricular ectopy.   DIAGNOSIS:   1. Mild coronary atherosclerosis as discussed above with widely patent   stent site within the proximal circumflex.   2. Left ventricular ejection fraction approximately 75% with an   increased left ventricular end-diastolic pressure of 28 mmHg, trace   mitral regurgitation in the setting of ventricular ectopy, and no   significant pullback gradient.   DISCUSSION: I reviewed the results with the patient and with Dr. Dannielle Burn   by phone. At this point I would anticipate continued medical therapy.   Discharge will be planned for later today with follow-up in the [facility]   office with Dr. Dannielle Burn.       07/2013 Carotid US RICA 8-36%, LICA 62-94%, patent antegrade vertebrals   04/2014 echo Study Conclusions  - Left ventricle: The cavity size was normal. Wall thickness was   increased in a pattern of mild LVH. Systolic function was normal.   The estimated ejection fraction was in the range of 60% to 65%.   There is evidence of diastolic dysfunction, indeterminant grade.   Wall motion was normal; there were no regional wall motion   abnormalities. - Aortic valve: Mildly calcified annulus. Mildly thickened   leaflets. There is aortic sclerosis without  stenosis. Valve area   (VTI):  2.89 cm^2. Valve area (Vmax): 2.58 cm^2. - Mitral valve: There was mild regurgitation. - Technically adequate study.   Jan 2016 Carotid US RICA 9-17%, LICA 91-50%, >56% ECA bilateral, right subclavian stenosis   04/2016 Carotid US RICA 9-79%, LICA 48-01%      04/5536 carotid US Final Interpretation:  Right Carotid: Velocities in the right ICA are consistent with a 1-39%  stenosis.                The ECA appears >50% stenosed. Stable from prior exam.   Left Carotid: Velocities in the left ICA are consistent with a 1-39%  stenosis.               The ECA appears >50% stenosed. Stable from prior exam.   Vertebrals:  Bilateral vertebral arteries demonstrate antegrade flow.  Subclavians: Right subclavian artery was stenotic. Normal flow  hemodynamics were               seen in the left subclavian artery. Stable from prior exam.     Assessment and Plan   1. CAD - no symptoms, conitnue currrent meds - EKG SR, no acute ischemic changes       2. HTN -she is at goal, continue current meds     3. Hyperlipidemia -she is at goal, continue current meds  4. Carotid stenosis - due for repeat carotid US, will order   F/u 1 year.      Arnoldo Lenis, M.D.

## 2022-11-20 DIAGNOSIS — H353133 Nonexudative age-related macular degeneration, bilateral, advanced atrophic without subfoveal involvement: Secondary | ICD-10-CM | POA: Diagnosis not present

## 2022-11-23 ENCOUNTER — Ambulatory Visit: Payer: Medicare Other | Attending: Cardiology

## 2022-11-23 DIAGNOSIS — I6523 Occlusion and stenosis of bilateral carotid arteries: Secondary | ICD-10-CM | POA: Insufficient documentation

## 2022-11-23 DIAGNOSIS — I6529 Occlusion and stenosis of unspecified carotid artery: Secondary | ICD-10-CM | POA: Diagnosis not present

## 2022-12-04 ENCOUNTER — Telehealth: Payer: Self-pay | Admitting: *Deleted

## 2022-12-04 NOTE — Telephone Encounter (Signed)
-----  Message from Arnoldo Lenis, MD sent at 11/27/2022  3:53 PM EST ----- Mild bilateral plaque in the arties of the neck, continue to monitor   Zandra Abts MD

## 2022-12-04 NOTE — Telephone Encounter (Signed)
Laurine Blazer, LPN 12/23/4707  6:28 PM EST Back to Top    Notified, copy to pcp.

## 2022-12-20 DIAGNOSIS — D239 Other benign neoplasm of skin, unspecified: Secondary | ICD-10-CM | POA: Diagnosis not present

## 2022-12-20 DIAGNOSIS — L570 Actinic keratosis: Secondary | ICD-10-CM | POA: Diagnosis not present

## 2022-12-20 DIAGNOSIS — Z85828 Personal history of other malignant neoplasm of skin: Secondary | ICD-10-CM | POA: Diagnosis not present

## 2022-12-20 DIAGNOSIS — Z1283 Encounter for screening for malignant neoplasm of skin: Secondary | ICD-10-CM | POA: Diagnosis not present

## 2023-01-11 ENCOUNTER — Other Ambulatory Visit: Payer: Self-pay | Admitting: Cardiology

## 2023-01-23 DIAGNOSIS — I152 Hypertension secondary to endocrine disorders: Secondary | ICD-10-CM | POA: Diagnosis not present

## 2023-01-23 DIAGNOSIS — D692 Other nonthrombocytopenic purpura: Secondary | ICD-10-CM | POA: Diagnosis not present

## 2023-01-23 DIAGNOSIS — I10 Essential (primary) hypertension: Secondary | ICD-10-CM | POA: Diagnosis not present

## 2023-01-23 DIAGNOSIS — E1159 Type 2 diabetes mellitus with other circulatory complications: Secondary | ICD-10-CM | POA: Diagnosis not present

## 2023-01-23 DIAGNOSIS — Z299 Encounter for prophylactic measures, unspecified: Secondary | ICD-10-CM | POA: Diagnosis not present

## 2023-02-19 DIAGNOSIS — Z299 Encounter for prophylactic measures, unspecified: Secondary | ICD-10-CM | POA: Diagnosis not present

## 2023-02-19 DIAGNOSIS — F419 Anxiety disorder, unspecified: Secondary | ICD-10-CM | POA: Diagnosis not present

## 2023-02-19 DIAGNOSIS — E1165 Type 2 diabetes mellitus with hyperglycemia: Secondary | ICD-10-CM | POA: Diagnosis not present

## 2023-02-19 DIAGNOSIS — I10 Essential (primary) hypertension: Secondary | ICD-10-CM | POA: Diagnosis not present

## 2023-02-19 DIAGNOSIS — I2510 Atherosclerotic heart disease of native coronary artery without angina pectoris: Secondary | ICD-10-CM | POA: Diagnosis not present

## 2023-03-14 ENCOUNTER — Other Ambulatory Visit: Payer: Self-pay | Admitting: Cardiology

## 2023-04-30 ENCOUNTER — Telehealth: Payer: Self-pay | Admitting: Cardiology

## 2023-04-30 ENCOUNTER — Other Ambulatory Visit: Payer: Self-pay | Admitting: Cardiology

## 2023-04-30 MED ORDER — ISOSORBIDE MONONITRATE ER 30 MG PO TB24: 30.0000 mg | ORAL_TABLET | Freq: Every day | ORAL | 6 refills | Status: DC

## 2023-04-30 NOTE — Telephone Encounter (Signed)
*  STAT* If patient is at the pharmacy, call can be transferred to refill team.   1. Which medications need to be refilled? (please list name of each medication and dose if known) Isisorbide 30   2. Which pharmacy/location (including street and city if local pharmacy) is medication to be sent to?[facility] [place]   3. Do they need a 30 day or 90 day supply? 30

## 2023-05-09 DIAGNOSIS — I10 Essential (primary) hypertension: Secondary | ICD-10-CM | POA: Diagnosis not present

## 2023-05-09 DIAGNOSIS — I152 Hypertension secondary to endocrine disorders: Secondary | ICD-10-CM | POA: Diagnosis not present

## 2023-05-09 DIAGNOSIS — E1159 Type 2 diabetes mellitus with other circulatory complications: Secondary | ICD-10-CM | POA: Diagnosis not present

## 2023-05-09 DIAGNOSIS — Z299 Encounter for prophylactic measures, unspecified: Secondary | ICD-10-CM | POA: Diagnosis not present

## 2023-05-21 DIAGNOSIS — J309 Allergic rhinitis, unspecified: Secondary | ICD-10-CM | POA: Diagnosis not present

## 2023-05-21 DIAGNOSIS — I10 Essential (primary) hypertension: Secondary | ICD-10-CM | POA: Diagnosis not present

## 2023-05-21 DIAGNOSIS — M8580 Other specified disorders of bone density and structure, unspecified site: Secondary | ICD-10-CM | POA: Diagnosis not present

## 2023-05-21 DIAGNOSIS — Z299 Encounter for prophylactic measures, unspecified: Secondary | ICD-10-CM | POA: Diagnosis not present

## 2023-05-21 DIAGNOSIS — R067 Sneezing: Secondary | ICD-10-CM | POA: Diagnosis not present

## 2023-05-24 DIAGNOSIS — H353113 Nonexudative age-related macular degeneration, right eye, advanced atrophic without subfoveal involvement: Secondary | ICD-10-CM | POA: Diagnosis not present

## 2023-06-19 DIAGNOSIS — L570 Actinic keratosis: Secondary | ICD-10-CM | POA: Diagnosis not present

## 2023-06-19 DIAGNOSIS — Z85828 Personal history of other malignant neoplasm of skin: Secondary | ICD-10-CM | POA: Diagnosis not present

## 2023-06-19 DIAGNOSIS — D485 Neoplasm of uncertain behavior of skin: Secondary | ICD-10-CM | POA: Diagnosis not present

## 2023-07-11 ENCOUNTER — Other Ambulatory Visit: Payer: Self-pay | Admitting: Cardiology

## 2023-07-17 ENCOUNTER — Telehealth: Payer: Self-pay | Admitting: Cardiology

## 2023-07-17 MED ORDER — CARVEDILOL 12.5 MG PO TABS: 12.5000 mg | ORAL_TABLET | Freq: Two times a day (BID) | ORAL | 1 refills | Status: DC

## 2023-07-17 NOTE — Telephone Encounter (Signed)
*  STAT* If patient is at the pharmacy, call can be transferred to refill team.   1. Which medications need to be refilled? (please list name of each medication and dose if known  Carvedilol 12.5 mg    2. Would you like to learn more about the convenience, safety, & potential cost savings by using the [facility]?  maybe   3. Are you open to using the [facility] (Type [facility].    4. Which pharmacy/location (including street and city if local pharmacy) is medication to be sent to?[facility]   [place]    5. Do they need a 30 day or 90 day supply? 90

## 2023-07-17 NOTE — Telephone Encounter (Signed)
Medication filled.  

## 2023-08-13 DIAGNOSIS — F419 Anxiety disorder, unspecified: Secondary | ICD-10-CM | POA: Diagnosis not present

## 2023-08-13 DIAGNOSIS — Z299 Encounter for prophylactic measures, unspecified: Secondary | ICD-10-CM | POA: Diagnosis not present

## 2023-08-13 DIAGNOSIS — I10 Essential (primary) hypertension: Secondary | ICD-10-CM | POA: Diagnosis not present

## 2023-08-13 DIAGNOSIS — I152 Hypertension secondary to endocrine disorders: Secondary | ICD-10-CM | POA: Diagnosis not present

## 2023-08-13 DIAGNOSIS — Z23 Encounter for immunization: Secondary | ICD-10-CM | POA: Diagnosis not present

## 2023-08-13 DIAGNOSIS — E1159 Type 2 diabetes mellitus with other circulatory complications: Secondary | ICD-10-CM | POA: Diagnosis not present

## 2023-09-25 ENCOUNTER — Other Ambulatory Visit: Payer: Self-pay | Admitting: Internal Medicine

## 2023-09-25 DIAGNOSIS — Z1231 Encounter for screening mammogram for malignant neoplasm of breast: Secondary | ICD-10-CM

## 2023-10-23 DIAGNOSIS — Z7189 Other specified counseling: Secondary | ICD-10-CM | POA: Diagnosis not present

## 2023-10-23 DIAGNOSIS — Z1339 Encounter for screening examination for other mental health and behavioral disorders: Secondary | ICD-10-CM | POA: Diagnosis not present

## 2023-10-23 DIAGNOSIS — R5383 Other fatigue: Secondary | ICD-10-CM | POA: Diagnosis not present

## 2023-10-23 DIAGNOSIS — E7800 Pure hypercholesterolemia, unspecified: Secondary | ICD-10-CM | POA: Diagnosis not present

## 2023-10-23 DIAGNOSIS — Z299 Encounter for prophylactic measures, unspecified: Secondary | ICD-10-CM | POA: Diagnosis not present

## 2023-10-23 DIAGNOSIS — Z79899 Other long term (current) drug therapy: Secondary | ICD-10-CM | POA: Diagnosis not present

## 2023-10-23 DIAGNOSIS — I10 Essential (primary) hypertension: Secondary | ICD-10-CM | POA: Diagnosis not present

## 2023-10-23 DIAGNOSIS — Z1331 Encounter for screening for depression: Secondary | ICD-10-CM | POA: Diagnosis not present

## 2023-10-23 DIAGNOSIS — Z0000 Encounter for general adult medical examination without abnormal findings: Secondary | ICD-10-CM | POA: Diagnosis not present

## 2023-10-27 ENCOUNTER — Other Ambulatory Visit: Payer: Self-pay | Admitting: Cardiology

## 2023-10-29 ENCOUNTER — Ambulatory Visit
Admission: RE | Admit: 2023-10-29 | Discharge: 2023-10-29 | Disposition: A | Discharge status: Home / Self Care | Payer: Medicare Other | Source: Ambulatory Visit | Attending: Internal Medicine | Admitting: Internal Medicine

## 2023-10-29 DIAGNOSIS — Z1231 Encounter for screening mammogram for malignant neoplasm of breast: Secondary | ICD-10-CM

## 2023-11-27 DIAGNOSIS — H353133 Nonexudative age-related macular degeneration, bilateral, advanced atrophic without subfoveal involvement: Secondary | ICD-10-CM | POA: Diagnosis not present

## 2023-12-03 DIAGNOSIS — E1165 Type 2 diabetes mellitus with hyperglycemia: Secondary | ICD-10-CM | POA: Diagnosis not present

## 2023-12-03 DIAGNOSIS — Z299 Encounter for prophylactic measures, unspecified: Secondary | ICD-10-CM | POA: Diagnosis not present

## 2023-12-03 DIAGNOSIS — I10 Essential (primary) hypertension: Secondary | ICD-10-CM | POA: Diagnosis not present

## 2023-12-07 ENCOUNTER — Encounter: Payer: Self-pay | Admitting: Cardiology

## 2023-12-07 ENCOUNTER — Ambulatory Visit: Payer: Medicare Other | Attending: Cardiology | Admitting: Cardiology

## 2023-12-07 VITALS — BP 120/54 | HR 77 | Ht 59.0 in | Wt 146.0 lb

## 2023-12-07 DIAGNOSIS — E782 Mixed hyperlipidemia: Secondary | ICD-10-CM | POA: Diagnosis not present

## 2023-12-07 DIAGNOSIS — I2510 Atherosclerotic heart disease of native coronary artery without angina pectoris: Secondary | ICD-10-CM | POA: Diagnosis not present

## 2023-12-07 DIAGNOSIS — I6529 Occlusion and stenosis of unspecified carotid artery: Secondary | ICD-10-CM | POA: Diagnosis not present

## 2023-12-07 DIAGNOSIS — I10 Essential (primary) hypertension: Secondary | ICD-10-CM | POA: Diagnosis not present

## 2023-12-07 NOTE — Progress Notes (Signed)
Clinical Summary Ms. Cockerill is a 73 y.o.female seen today for follow up of the following medical problems.     1. CAD - hx of NSTEMI in 2008, received a BMS to the LCX.   - 04/2014 echo: LVEF 60-65%   - no chest pains, no SOB/DOE - compliant with meds     2. PAD - mild left renal artery stenosis, bp controlled with no reported history of renal disease.   - severe ostial SMA stenosis, denies any stomach pain.   - mild to moderate bilateral carotid stenosis    -due for repeat carotid US   Jan 2024 carotid US: RICA 1-39%,LICA 1-39%     3. HTN - she is compliant with meds     4. Hyperlipidemia - she is compliant with statin.    - 09/2022 TC 142 TG 131 HDL 51 LDL 68 - 10/2023 TC 133 TG 115 HDL 48 LDL 64      Past Medical History:  Diagnosis Date   Anxiety and depression    CAD (coronary artery disease)    NSTEMI/BMS pCFX, 01/08   Colitis    CVD (cerebrovascular disease)    non obstructive   Diabetes mellitus without complication (HCC)    Dyslipidemia    HTN (hypertension)    Myocardial infarction (HCC)    PAD (peripheral artery disease) (HCC)    mild left renal artery stenosis; severe osital SMA stenosis     Allergies  Allergen Reactions   Penicillins     REACTION: ?rash     Current Outpatient Medications  Medication Sig Dispense Refill   ALPRAZolam (XANAX) 0.25 MG tablet Take 0.25 mg by mouth 2 (two) times daily as needed.       amLODipine (NORVASC) 10 MG tablet Take 1 tablet (10 mg total) by mouth daily. 90 tablet 2   aspirin 81 MG EC tablet Take 81 mg by mouth daily.       atorvastatin (LIPITOR) 80 MG tablet TAKE ONE TABLET BY MOUTH DAILY. 90 tablet 1   carvedilol (COREG) 12.5 MG tablet Take 1 tablet (12.5 mg total) by mouth 2 (two) times daily. 180 tablet 1   Cholecalciferol (VITAMIN D3) 2000 UNITS capsule Take 2,000 Units by mouth daily.     CINNAMON PO Take 1,000 mg by mouth 2 (two) times daily.     fluticasone (FLONASE) 50 MCG/ACT nasal  spray Place 1 spray into both nostrils daily as needed for allergies or rhinitis.      hydrochlorothiazide (HYDRODIURIL) 25 MG tablet TAKE ONE TABLET BY MOUTH DAILY 90 tablet 2   isosorbide mononitrate (IMDUR) 30 MG 24 hr tablet TAKE 1 TABLET(30 MG) BY MOUTH DAILY 90 tablet 0   magnesium oxide (MAG-OX) 400 MG tablet Take 400 mg by mouth 2 (two) times daily.     Multiple Vitamins-Minerals (ONE-A-DAY EXTRAS ANTIOXIDANT) CAPS Take 1 capsule by mouth daily.       Multiple Vitamins-Minerals (PRESERVISION/LUTEIN) CAPS Take 1 capsule by mouth 2 (two) times daily.       nitroGLYCERIN (NITROSTAT) 0.4 MG SL tablet Place 1 tablet (0.4 mg total) under the tongue every 5 (five) minutes as needed. 25 tablet 3   Omega-3 Fatty Acids (FISH OIL) 1200 MG CAPS Take 1 capsule by mouth 2 (two) times a day.      potassium chloride SA (KLOR-CON M) 20 MEQ tablet TAKE 1 AND 1/2 TABLETS BY MOUTH TWICE DAILY 270 tablet 1   venlafaxine (EFFEXOR-XR) 75 MG 24  hr capsule Take 225 mg by mouth daily.       No current facility-administered medications for this visit.     Past Surgical History:  Procedure Laterality Date   ABDOMINAL HYSTERECTOMY     CATARACT EXTRACTION W/PHACO Right 08/07/2022   Procedure: CATARACT EXTRACTION PHACO AND INTRAOCULAR LENS PLACEMENT (IOC);  Surgeon: Fabio Pierce, MD;  Location: [facility];  Service: Ophthalmology;  Laterality: Right;  CDE:7.10   CATARACT EXTRACTION W/PHACO Left 08/21/2022   Procedure: CATARACT EXTRACTION PHACO AND INTRAOCULAR LENS PLACEMENT (IOC);  Surgeon: Fabio Pierce, MD;  Location: [facility];  Service: Ophthalmology;  Laterality: Left;  CDE: 9.77   CORONARY ANGIOPLASTY WITH STENT PLACEMENT  2008   BMS x 2 CFX     Allergies  Allergen Reactions   Penicillins     REACTION: ?rash      Family History  Problem Relation Age of Onset   Cirrhosis Mother    COPD Father    Asthma Maternal Grandmother    Diabetes Neg Hx    Hypertension Neg Hx    Coronary artery disease Neg Hx     Breast cancer Neg Hx      Social History Ms. Tercero reports that she has never smoked. She has never used smokeless tobacco. Ms. Lavalais reports that she does not currently use alcohol.      Physical Examination Today's Vitals   12/07/23 1035  BP: (!) 120/54  Pulse: 77  SpO2: 95%  Weight: 146 lb (66.2 kg)  Height: 4\' 11"  (1.499 m)   Body mass index is 29.49 kg/m.  Gen: resting comfortably, no acute distress HEENT: no scleral icterus, pupils equal round and reactive, no palptable cervical adenopathy,  CV: RRR, no mrg, no jvd Resp: Clear to auscultation bilaterally GI: abdomen is soft, non-tender, non-distended, normal bowel sounds, no hepatosplenomegaly MSK: extremities are warm, no edema.  Skin: warm, no rash Neuro:  no focal deficits Psych: appropriate affect   Diagnostic Studies 11/2006 Cath FINDINGS:   1. LV: 133/13/35.   2. No aortic stenosis on pullback.   3. Left ventriculography: This was deferred due to markedly elevated   left ventricular end diastolic pressure.   4. Left main: There is no left main.   5. LAD: A fairly large vessel which wraps the apex of the heart. It   gives rise to a moderate sized diagonal. The proximal LAD is   heavily calcified. There is diffuse 30% stenosis. There is then a   focal 50% stenosis just after the origin of the diagonal.   6. Circumflex: Fairly large codominant vessel. There was a 99%   stenosis proximally and a 60% stenosis just after the marginal   which were stented using two overlapping bare metal stents. Flow   improved from TIMI II to TIMI III.   7. RCA: Relatively small, codominant vessel. There is a 30% stenosis   in the mid vessel just after the origin of acute marginal. The   acute marginal has a 90% ostial stenosis. It is a fairly small   vessel.   RECOMMENDATIONS: Successful percutaneous revascularization of the   proximal circumflex. A small non flow limiting proximal edge dissection   required an  additional overlapping stent. There was an excellent final   result. Her left ventricular end diastolic pressure is markedly   elevated. I, therefore, did not perform ventriculography. We will plan   on continuation of beta blocker in addition to ACE inhibitor. We will   continue aspirin indefinitely.  Plavix should be continued for a minimum   of 30 days and preferably nine months given the acute presentation. She   will need echocardiogram to assess left ventricular systolic function.   12/2006 Cath HEMODYNAMIC RESULTS: Aorta 143/74 mmHg, left ventricle 140/28 mmHg.   ANGIOGRAPHIC FINDINGS:   1. There is a very short left main, essentially nearby separate ostia   of the left anterior descending and circumflex coronary arteries.   2. The left anterior descending is a medium to small caliber vessel   with distal tapering and 30% midvessel stenosis. There is   essentially one medium-sized branching diagonal. No flow-limiting   stenoses are noted.   3. The circumflex coronary artery is a medium to large caliber vessel   that is codominant with a small right coronary artery. Stent site   is visualized from the proximal portion of the vessel and was   widely patent. Otherwise there are minor luminal irregularities.   There is a ramus intermedius and a large multi-branched obtuse   marginal in the midvessel segment.   4. The right coronary artery is small and codominant. There is an   acute marginal visualized with approximately 80% ostial stenosis   which is not a new finding. Otherwise minor luminal irregularities   are noted.   5. Left ventriculography is performed in the RAO projection and   reveals an ejection fraction approximately 75% with no focal   anterior or inferior wall motion abnormality and trace mitral   regurgitation in the setting of ventricular ectopy.   DIAGNOSIS:   1. Mild coronary atherosclerosis as discussed above with widely patent   stent site within the proximal  circumflex.   2. Left ventricular ejection fraction approximately 75% with an   increased left ventricular end-diastolic pressure of 28 mmHg, trace   mitral regurgitation in the setting of ventricular ectopy, and no   significant pullback gradient.   DISCUSSION: I reviewed the results with the patient and with Dr. [name]   by phone. At this point I would anticipate continued medical therapy.   Discharge will be planned for later today with follow-up in the [facility]   office with Dr. [name].       07/2013 Carotid US RICA 1-39%, LICA 40-59%, patent antegrade vertebrals   04/2014 echo Study Conclusions  - Left ventricle: The cavity size was normal. Wall thickness was   increased in a pattern of mild LVH. Systolic function was normal.   The estimated ejection fraction was in the range of 60% to 65%.   There is evidence of diastolic dysfunction, indeterminant grade.   Wall motion was normal; there were no regional wall motion   abnormalities. - Aortic valve: Mildly calcified annulus. Mildly thickened   leaflets. There is aortic sclerosis without stenosis. Valve area   (VTI): 2.89 cm^2. Valve area (Vmax): 2.58 cm^2. - Mitral valve: There was mild regurgitation. - Technically adequate study.   Jan 2016 Carotid US RICA 1-39%, LICA 40-59%, >50% ECA bilateral, right subclavian stenosis   04/2016 Carotid US RICA 1-39%, LICA 40-59%      05/2018 carotid US Final Interpretation:  Right Carotid: Velocities in the right ICA are consistent with a 1-39%  stenosis.                The ECA appears >50% stenosed. Stable from prior exam.   Left Carotid: Velocities in the left ICA are consistent with a 1-39%  stenosis.  The ECA appears >50% stenosed. Stable from prior exam.   Vertebrals:  Bilateral vertebral arteries demonstrate antegrade flow.  Subclavians: Right subclavian artery was stenotic. Normal flow  hemodynamics were               seen in the left subclavian artery. Stable from  prior exam.      Jan 2024 carotid US Summary:  Right Carotid: Velocities in the right ICA are consistent with a 1-39%  stenosis.                The ECA appears >50% stenosed.   Left Carotid: Velocities in the left ICA are consistent with a 1-39%  stenosis.               The ECA appears >50% stenosed.   Vertebrals:  Bilateral vertebral arteries demonstrate antegrade flow.  Subclavians: Normal flow hemodynamics were seen in bilateral subclavian               arteries.    Assessment and Plan  1. CAD - no recent symptoms - EKG today SR, no ischemic changes - continue current meds   2. HTN - bp is at goal, continue current meds     3. Hyperlipidemia - at goal, continue current meds  4. Carotid stenosis -mild by recent US, continue to monitor. Does not require repeat this year, likely next year      Antoine Poche, M.D.

## 2023-12-07 NOTE — Patient Instructions (Signed)

## 2023-12-17 DIAGNOSIS — L570 Actinic keratosis: Secondary | ICD-10-CM | POA: Diagnosis not present

## 2023-12-17 DIAGNOSIS — Z85828 Personal history of other malignant neoplasm of skin: Secondary | ICD-10-CM | POA: Diagnosis not present

## 2023-12-17 DIAGNOSIS — I781 Nevus, non-neoplastic: Secondary | ICD-10-CM | POA: Diagnosis not present

## 2023-12-19 DIAGNOSIS — H353112 Nonexudative age-related macular degeneration, right eye, intermediate dry stage: Secondary | ICD-10-CM | POA: Diagnosis not present

## 2023-12-19 DIAGNOSIS — E119 Type 2 diabetes mellitus without complications: Secondary | ICD-10-CM | POA: Diagnosis not present

## 2023-12-19 DIAGNOSIS — H43812 Vitreous degeneration, left eye: Secondary | ICD-10-CM | POA: Diagnosis not present

## 2023-12-19 DIAGNOSIS — H353123 Nonexudative age-related macular degeneration, left eye, advanced atrophic without subfoveal involvement: Secondary | ICD-10-CM | POA: Diagnosis not present

## 2023-12-30 ENCOUNTER — Other Ambulatory Visit: Payer: Self-pay | Admitting: Cardiology

## 2024-01-09 DIAGNOSIS — H353112 Nonexudative age-related macular degeneration, right eye, intermediate dry stage: Secondary | ICD-10-CM | POA: Diagnosis not present

## 2024-01-09 DIAGNOSIS — H353123 Nonexudative age-related macular degeneration, left eye, advanced atrophic without subfoveal involvement: Secondary | ICD-10-CM | POA: Diagnosis not present

## 2024-01-21 ENCOUNTER — Other Ambulatory Visit: Payer: Self-pay | Admitting: Cardiology

## 2024-02-01 DIAGNOSIS — I10 Essential (primary) hypertension: Secondary | ICD-10-CM | POA: Diagnosis not present

## 2024-02-01 DIAGNOSIS — F419 Anxiety disorder, unspecified: Secondary | ICD-10-CM | POA: Diagnosis not present

## 2024-02-01 DIAGNOSIS — E1165 Type 2 diabetes mellitus with hyperglycemia: Secondary | ICD-10-CM | POA: Diagnosis not present

## 2024-02-01 DIAGNOSIS — Z299 Encounter for prophylactic measures, unspecified: Secondary | ICD-10-CM | POA: Diagnosis not present

## 2024-03-05 DIAGNOSIS — H353123 Nonexudative age-related macular degeneration, left eye, advanced atrophic without subfoveal involvement: Secondary | ICD-10-CM | POA: Diagnosis not present

## 2024-03-05 DIAGNOSIS — H43812 Vitreous degeneration, left eye: Secondary | ICD-10-CM | POA: Diagnosis not present

## 2024-03-05 DIAGNOSIS — H353112 Nonexudative age-related macular degeneration, right eye, intermediate dry stage: Secondary | ICD-10-CM | POA: Diagnosis not present

## 2024-03-05 DIAGNOSIS — E119 Type 2 diabetes mellitus without complications: Secondary | ICD-10-CM | POA: Diagnosis not present

## 2024-03-11 DIAGNOSIS — I10 Essential (primary) hypertension: Secondary | ICD-10-CM | POA: Diagnosis not present

## 2024-03-11 DIAGNOSIS — Z299 Encounter for prophylactic measures, unspecified: Secondary | ICD-10-CM | POA: Diagnosis not present

## 2024-03-11 DIAGNOSIS — E1165 Type 2 diabetes mellitus with hyperglycemia: Secondary | ICD-10-CM | POA: Diagnosis not present

## 2024-04-30 DIAGNOSIS — H353112 Nonexudative age-related macular degeneration, right eye, intermediate dry stage: Secondary | ICD-10-CM | POA: Diagnosis not present

## 2024-04-30 DIAGNOSIS — E119 Type 2 diabetes mellitus without complications: Secondary | ICD-10-CM | POA: Diagnosis not present

## 2024-04-30 DIAGNOSIS — H43812 Vitreous degeneration, left eye: Secondary | ICD-10-CM | POA: Diagnosis not present

## 2024-04-30 DIAGNOSIS — H353123 Nonexudative age-related macular degeneration, left eye, advanced atrophic without subfoveal involvement: Secondary | ICD-10-CM | POA: Diagnosis not present

## 2024-06-05 DIAGNOSIS — H353113 Nonexudative age-related macular degeneration, right eye, advanced atrophic without subfoveal involvement: Secondary | ICD-10-CM | POA: Diagnosis not present

## 2024-06-17 DIAGNOSIS — L821 Other seborrheic keratosis: Secondary | ICD-10-CM | POA: Diagnosis not present

## 2024-06-17 DIAGNOSIS — L814 Other melanin hyperpigmentation: Secondary | ICD-10-CM | POA: Diagnosis not present

## 2024-06-17 DIAGNOSIS — L570 Actinic keratosis: Secondary | ICD-10-CM | POA: Diagnosis not present

## 2024-06-17 DIAGNOSIS — Z85828 Personal history of other malignant neoplasm of skin: Secondary | ICD-10-CM | POA: Diagnosis not present

## 2024-06-25 DIAGNOSIS — H43812 Vitreous degeneration, left eye: Secondary | ICD-10-CM | POA: Diagnosis not present

## 2024-06-25 DIAGNOSIS — E119 Type 2 diabetes mellitus without complications: Secondary | ICD-10-CM | POA: Diagnosis not present

## 2024-06-25 DIAGNOSIS — H353112 Nonexudative age-related macular degeneration, right eye, intermediate dry stage: Secondary | ICD-10-CM | POA: Diagnosis not present

## 2024-06-25 DIAGNOSIS — H353123 Nonexudative age-related macular degeneration, left eye, advanced atrophic without subfoveal involvement: Secondary | ICD-10-CM | POA: Diagnosis not present

## 2024-06-26 DIAGNOSIS — E119 Type 2 diabetes mellitus without complications: Secondary | ICD-10-CM | POA: Diagnosis not present

## 2024-06-26 DIAGNOSIS — Z299 Encounter for prophylactic measures, unspecified: Secondary | ICD-10-CM | POA: Diagnosis not present

## 2024-06-26 DIAGNOSIS — I10 Essential (primary) hypertension: Secondary | ICD-10-CM | POA: Diagnosis not present

## 2024-07-16 ENCOUNTER — Other Ambulatory Visit: Payer: Self-pay | Admitting: Cardiology

## 2024-08-15 DIAGNOSIS — E2839 Other primary ovarian failure: Secondary | ICD-10-CM | POA: Diagnosis not present

## 2024-08-20 DIAGNOSIS — H35433 Paving stone degeneration of retina, bilateral: Secondary | ICD-10-CM | POA: Diagnosis not present

## 2024-08-20 DIAGNOSIS — E119 Type 2 diabetes mellitus without complications: Secondary | ICD-10-CM | POA: Diagnosis not present

## 2024-08-20 DIAGNOSIS — H353112 Nonexudative age-related macular degeneration, right eye, intermediate dry stage: Secondary | ICD-10-CM | POA: Diagnosis not present

## 2024-08-20 DIAGNOSIS — H4312 Vitreous hemorrhage, left eye: Secondary | ICD-10-CM | POA: Diagnosis not present

## 2024-08-20 DIAGNOSIS — H43812 Vitreous degeneration, left eye: Secondary | ICD-10-CM | POA: Diagnosis not present

## 2024-08-20 DIAGNOSIS — H353123 Nonexudative age-related macular degeneration, left eye, advanced atrophic without subfoveal involvement: Secondary | ICD-10-CM | POA: Diagnosis not present

## 2024-08-20 DIAGNOSIS — H26492 Other secondary cataract, left eye: Secondary | ICD-10-CM | POA: Diagnosis not present

## 2024-09-14 ENCOUNTER — Other Ambulatory Visit: Payer: Self-pay | Admitting: Cardiology

## 2024-09-15 DIAGNOSIS — E2839 Other primary ovarian failure: Secondary | ICD-10-CM | POA: Diagnosis not present

## 2024-09-15 DIAGNOSIS — Z299 Encounter for prophylactic measures, unspecified: Secondary | ICD-10-CM | POA: Diagnosis not present

## 2024-09-15 DIAGNOSIS — I10 Essential (primary) hypertension: Secondary | ICD-10-CM | POA: Diagnosis not present

## 2024-09-15 DIAGNOSIS — F419 Anxiety disorder, unspecified: Secondary | ICD-10-CM | POA: Diagnosis not present

## 2024-09-18 DIAGNOSIS — Z23 Encounter for immunization: Secondary | ICD-10-CM | POA: Diagnosis not present

## 2024-09-29 ENCOUNTER — Other Ambulatory Visit: Payer: Self-pay | Admitting: Internal Medicine

## 2024-09-29 DIAGNOSIS — Z1231 Encounter for screening mammogram for malignant neoplasm of breast: Secondary | ICD-10-CM

## 2024-10-13 DIAGNOSIS — Z1339 Encounter for screening examination for other mental health and behavioral disorders: Secondary | ICD-10-CM | POA: Diagnosis not present

## 2024-10-13 DIAGNOSIS — R5383 Other fatigue: Secondary | ICD-10-CM | POA: Diagnosis not present

## 2024-10-13 DIAGNOSIS — Z0000 Encounter for general adult medical examination without abnormal findings: Secondary | ICD-10-CM | POA: Diagnosis not present

## 2024-10-13 DIAGNOSIS — F419 Anxiety disorder, unspecified: Secondary | ICD-10-CM | POA: Diagnosis not present

## 2024-10-13 DIAGNOSIS — Z299 Encounter for prophylactic measures, unspecified: Secondary | ICD-10-CM | POA: Diagnosis not present

## 2024-10-13 DIAGNOSIS — Z1331 Encounter for screening for depression: Secondary | ICD-10-CM | POA: Diagnosis not present

## 2024-10-13 DIAGNOSIS — Z79899 Other long term (current) drug therapy: Secondary | ICD-10-CM | POA: Diagnosis not present

## 2024-10-13 DIAGNOSIS — E7800 Pure hypercholesterolemia, unspecified: Secondary | ICD-10-CM | POA: Diagnosis not present

## 2024-10-13 DIAGNOSIS — I10 Essential (primary) hypertension: Secondary | ICD-10-CM | POA: Diagnosis not present

## 2024-10-13 DIAGNOSIS — Z7189 Other specified counseling: Secondary | ICD-10-CM | POA: Diagnosis not present

## 2024-10-20 DIAGNOSIS — H353123 Nonexudative age-related macular degeneration, left eye, advanced atrophic without subfoveal involvement: Secondary | ICD-10-CM | POA: Diagnosis not present

## 2024-10-20 DIAGNOSIS — H26492 Other secondary cataract, left eye: Secondary | ICD-10-CM | POA: Diagnosis not present

## 2024-10-20 DIAGNOSIS — H35433 Paving stone degeneration of retina, bilateral: Secondary | ICD-10-CM | POA: Diagnosis not present

## 2024-10-20 DIAGNOSIS — E119 Type 2 diabetes mellitus without complications: Secondary | ICD-10-CM | POA: Diagnosis not present

## 2024-10-20 DIAGNOSIS — H353133 Nonexudative age-related macular degeneration, bilateral, advanced atrophic without subfoveal involvement: Secondary | ICD-10-CM | POA: Diagnosis not present

## 2024-10-20 DIAGNOSIS — H43812 Vitreous degeneration, left eye: Secondary | ICD-10-CM | POA: Diagnosis not present

## 2024-11-18 ENCOUNTER — Ambulatory Visit
Admission: RE | Admit: 2024-11-18 | Discharge: 2024-11-18 | Disposition: A | Source: Ambulatory Visit | Attending: Internal Medicine | Admitting: Internal Medicine

## 2024-11-18 DIAGNOSIS — Z1231 Encounter for screening mammogram for malignant neoplasm of breast: Secondary | ICD-10-CM

## 2024-11-28 ENCOUNTER — Encounter: Payer: Self-pay | Admitting: Cardiology

## 2024-11-28 ENCOUNTER — Ambulatory Visit: Attending: Cardiology | Admitting: Cardiology

## 2024-11-28 VITALS — BP 140/70 | HR 78 | Ht 59.0 in | Wt 144.2 lb

## 2024-11-28 DIAGNOSIS — I10 Essential (primary) hypertension: Secondary | ICD-10-CM

## 2024-11-28 NOTE — Progress Notes (Unsigned)
 "     Clinical Summary Ms. Justen is a 74 y.o.female seen today for follow up of the following medical problems.     1. CAD - hx of NSTEMI in 2008, received a BMS to the LCX.   - 04/2014 echo: LVEF 60-65%   -- no chest pains, no SOB/DOE - compliant with meds   2. PAD - mild left renal artery stenosis, bp controlled with no reported history of renal disease.   - severe ostial SMA stenosis, denies any stomach pain.   - mild to moderate bilateral carotid stenosis    -due for repeat carotid US    Jan 2024 carotid US : RICA 1-39%,LICA 1-39%     3. HTN - compliant withmeds     4. Hyperlipidemia - she is compliant with statin.    - 09/2022 TC 142 TG 131 HDL 51 LDL 68 - 10/2023 TC 133 TG 115 HDL 48 LDL 64   Past Medical History:  Diagnosis Date   Anxiety and depression    CAD (coronary artery disease)    NSTEMI/BMS pCFX, 01/08   Colitis    CVD (cerebrovascular disease)    non obstructive   Diabetes mellitus without complication (HCC)    Dyslipidemia    HTN (hypertension)    Myocardial infarction (HCC)    PAD (peripheral artery disease)    mild left renal artery stenosis; severe osital SMA stenosis     Allergies[1]   Current Outpatient Medications  Medication Sig Dispense Refill   alendronate (FOSAMAX) 70 MG tablet Take 70 mg by mouth once a week.     ALPRAZolam (XANAX) 0.25 MG tablet Take 0.25 mg by mouth 2 (two) times daily as needed.       amLODipine  (NORVASC ) 10 MG tablet Take 1 tablet (10 mg total) by mouth daily. 90 tablet 2   aspirin 81 MG EC tablet Take 81 mg by mouth daily.       atorvastatin  (LIPITOR) 80 MG tablet TAKE ONE TABLET BY MOUTH DAILY. 90 tablet 1   carvedilol  (COREG ) 12.5 MG tablet TAKE ONE TABLET BY MOUTH TWICE DAILY 180 tablet 2   Cholecalciferol (VITAMIN D3) 2000 UNITS capsule Take 2,000 Units by mouth daily.     CINNAMON PO Take 1,000 mg by mouth 2 (two) times daily.     fluticasone (FLONASE) 50 MCG/ACT nasal spray Place 1 spray into both  nostrils daily as needed for allergies or rhinitis.      hydrochlorothiazide  (HYDRODIURIL ) 25 MG tablet TAKE 1 TABLET BY MOUTH EVERY DAY 90 tablet 0   isosorbide  mononitrate (IMDUR ) 30 MG 24 hr tablet TAKE 1 TABLET(30 MG) BY MOUTH DAILY 90 tablet 2   magnesium oxide (MAG-OX) 400 MG tablet Take 400 mg by mouth 2 (two) times daily.     Multiple Vitamins-Minerals (ONE-A-DAY EXTRAS ANTIOXIDANT) CAPS Take 1 capsule by mouth daily.       Multiple Vitamins-Minerals (PRESERVISION/LUTEIN) CAPS Take 1 capsule by mouth 2 (two) times daily.       nitroGLYCERIN  (NITROSTAT ) 0.4 MG SL tablet Place 1 tablet (0.4 mg total) under the tongue every 5 (five) minutes as needed. 25 tablet 3   Omega-3 Fatty Acids (FISH OIL) 1200 MG CAPS Take 1 capsule by mouth 2 (two) times a day.      potassium chloride  SA (KLOR-CON  M) 20 MEQ tablet TAKE 1 AND 1/2 TABLETS BY MOUTH TWICE DAILY 270 tablet 1   venlafaxine (EFFEXOR-XR) 75 MG 24 hr capsule Take 225 mg by mouth daily.  No current facility-administered medications for this visit.     Past Surgical History:  Procedure Laterality Date   ABDOMINAL HYSTERECTOMY     BREAST BIOPSY Left    CATARACT EXTRACTION W/PHACO Right 08/07/2022   Procedure: CATARACT EXTRACTION PHACO AND INTRAOCULAR LENS PLACEMENT (IOC);  Surgeon: Harrie Agent, MD;  Location: [facility];  Service: Ophthalmology;  Laterality: Right;  CDE:7.10   CATARACT EXTRACTION W/PHACO Left 08/21/2022   Procedure: CATARACT EXTRACTION PHACO AND INTRAOCULAR LENS PLACEMENT (IOC);  Surgeon: Harrie Agent, MD;  Location: [facility];  Service: Ophthalmology;  Laterality: Left;  CDE: 9.77   CORONARY ANGIOPLASTY WITH STENT PLACEMENT  2008   BMS x 2 CFX     Allergies[2]    Family History  Problem Relation Age of Onset   Cirrhosis Mother    COPD Father    Asthma Maternal Grandmother    Diabetes Neg Hx    Hypertension Neg Hx    Coronary artery disease Neg Hx    Breast cancer Neg Hx      Social History Ms.  Loya reports that she has never smoked. She has never used smokeless tobacco. Ms. Yousuf reports that she does not currently use alcohol .   Review of Systems CONSTITUTIONAL: No weight loss, fever, chills, weakness or fatigue.  HEENT: Eyes: No visual loss, blurred vision, double vision or yellow sclerae.No hearing loss, sneezing, congestion, runny nose or sore throat.  SKIN: No rash or itching.  CARDIOVASCULAR:  RESPIRATORY: No shortness of breath, cough or sputum.  GASTROINTESTINAL: No anorexia, nausea, vomiting or diarrhea. No abdominal pain or blood.  GENITOURINARY: No burning on urination, no polyuria NEUROLOGICAL: No headache, dizziness, syncope, paralysis, ataxia, numbness or tingling in the extremities. No change in bowel or bladder control.  MUSCULOSKELETAL: No muscle, back pain, joint pain or stiffness.  LYMPHATICS: No enlarged nodes. No history of splenectomy.  PSYCHIATRIC: No history of depression or anxiety.  ENDOCRINOLOGIC: No reports of sweating, cold or heat intolerance. No polyuria or polydipsia.  SABRA   Physical Examination There were no vitals filed for this visit. There were no vitals filed for this visit.  Gen: resting comfortably, no acute distress HEENT: no scleral icterus, pupils equal round and reactive, no palptable cervical adenopathy,  CV Resp: Clear to auscultation bilaterally GI: abdomen is soft, non-tender, non-distended, normal bowel sounds, no hepatosplenomegaly MSK: extremities are warm, no edema.  Skin: warm, no rash Neuro:  no focal deficits Psych: appropriate affect   Diagnostic Studies  11/2006 Cath FINDINGS:   1. LV: 133/13/35.   2. No aortic stenosis on pullback.   3. Left ventriculography: This was deferred due to markedly elevated   left ventricular end diastolic pressure.   4. Left main: There is no left main.   5. LAD: A fairly large vessel which wraps the apex of the heart. It   gives rise to a moderate sized diagonal. The  proximal LAD is   heavily calcified. There is diffuse 30% stenosis. There is then a   focal 50% stenosis just after the origin of the diagonal.   6. Circumflex: Fairly large codominant vessel. There was a 99%   stenosis proximally and a 60% stenosis just after the marginal   which were stented using two overlapping bare metal stents. Flow   improved from TIMI II to TIMI III.   7. RCA: Relatively small, codominant vessel. There is a 30% stenosis   in the mid vessel just after the origin of acute marginal. The   acute  marginal has a 90% ostial stenosis. It is a fairly small   vessel.   RECOMMENDATIONS: Successful percutaneous revascularization of the   proximal circumflex. A small non flow limiting proximal edge dissection   required an additional overlapping stent. There was an excellent final   result. Her left ventricular end diastolic pressure is markedly   elevated. I, therefore, did not perform ventriculography. We will plan   on continuation of beta blocker in addition to ACE inhibitor. We will   continue aspirin indefinitely. Plavix should be continued for a minimum   of 30 days and preferably nine months given the acute presentation. She   will need echocardiogram to assess left ventricular systolic function.   12/2006 Cath HEMODYNAMIC RESULTS: Aorta 143/74 mmHg, left ventricle 140/28 mmHg.   ANGIOGRAPHIC FINDINGS:   1. There is a very short left main, essentially nearby separate ostia   of the left anterior descending and circumflex coronary arteries.   2. The left anterior descending is a medium to small caliber vessel   with distal tapering and 30% midvessel stenosis. There is   essentially one medium-sized branching diagonal. No flow-limiting   stenoses are noted.   3. The circumflex coronary artery is a medium to large caliber vessel   that is codominant with a small right coronary artery. Stent site   is visualized from the proximal portion of the vessel and was   widely  patent. Otherwise there are minor luminal irregularities.   There is a ramus intermedius and a large multi-branched obtuse   marginal in the midvessel segment.   4. The right coronary artery is small and codominant. There is an   acute marginal visualized with approximately 80% ostial stenosis   which is not a new finding. Otherwise minor luminal irregularities   are noted.   5. Left ventriculography is performed in the RAO projection and   reveals an ejection fraction approximately 75% with no focal   anterior or inferior wall motion abnormality and trace mitral   regurgitation in the setting of ventricular ectopy.   DIAGNOSIS:   1. Mild coronary atherosclerosis as discussed above with widely patent   stent site within the proximal circumflex.   2. Left ventricular ejection fraction approximately 75% with an   increased left ventricular end-diastolic pressure of 28 mmHg, trace   mitral regurgitation in the setting of ventricular ectopy, and no   significant pullback gradient.   DISCUSSION: I reviewed the results with the patient and with Dr. Jodine   by phone. At this point I would anticipate continued medical therapy.   Discharge will be planned for later today with follow-up in the [facility]   office with Dr. Jodine.       07/2013 Carotid US  RICA 1-39%, LICA 40-59%, patent antegrade vertebrals   04/2014 echo Study Conclusions  - Left ventricle: The cavity size was normal. Wall thickness was   increased in a pattern of mild LVH. Systolic function was normal.   The estimated ejection fraction was in the range of 60% to 65%.   There is evidence of diastolic dysfunction, indeterminant grade.   Wall motion was normal; there were no regional wall motion   abnormalities. - Aortic valve: Mildly calcified annulus. Mildly thickened   leaflets. There is aortic sclerosis without stenosis. Valve area   (VTI): 2.89 cm^2. Valve area (Vmax): 2.58 cm^2. - Mitral valve: There was mild  regurgitation. - Technically adequate study.   Jan 2016 Carotid US  RICA 1-39%, LICA 40-59%, >  50% ECA bilateral, right subclavian stenosis   04/2016 Carotid US  RICA 1-39%, LICA 40-59%      05/2018 carotid US  Final Interpretation:  Right Carotid: Velocities in the right ICA are consistent with a 1-39%  stenosis.                The ECA appears >50% stenosed. Stable from prior exam.   Left Carotid: Velocities in the left ICA are consistent with a 1-39%  stenosis.               The ECA appears >50% stenosed. Stable from prior exam.   Vertebrals:  Bilateral vertebral arteries demonstrate antegrade flow.  Subclavians: Right subclavian artery was stenotic. Normal flow  hemodynamics were               seen in the left subclavian artery. Stable from prior exam.      Jan 2024 carotid US  Summary:  Right Carotid: Velocities in the right ICA are consistent with a 1-39%  stenosis.                The ECA appears >50% stenosed.   Left Carotid: Velocities in the left ICA are consistent with a 1-39%  stenosis.               The ECA appears >50% stenosed.   Vertebrals:  Bilateral vertebral arteries demonstrate antegrade flow.  Subclavians: Normal flow hemodynamics were seen in bilateral subclavian               arteries.    Assessment and Plan   1. CAD - no recent symptoms - EKG today SR, no ischemic changes - continue current meds   2. HTN - bp is at goal, continue current meds     3. Hyperlipidemia - at goal, continue current meds   4. Carotid stenosis -mild by recent US , continue to monitor. Does not require repeat this year, likely next year       Dorn PHEBE Ross, M.D., F.A.C.C.     [1]  Allergies Allergen Reactions   Penicillins     REACTION: ?rash  [2]  Allergies Allergen Reactions   Penicillins     REACTION: ?rash   "

## 2024-11-28 NOTE — Patient Instructions (Addendum)
 Medication Instructions:   Continue all current medications.  Labwork:  none  Testing/Procedures:  none  Follow-Up:  Your physician wants you to follow up in:  1 year.  You should receive a recall letter in the mail about 2 months prior to the time you are due.  If you don't receive this, please call our office to schedule your follow up appointment.      Any Other Special Instructions Will Be Listed Below (If Applicable).  Nurse BP check in 2 weeks   If you need a refill on your cardiac medications before your next appointment, please call your pharmacy.

## 2024-12-09 ENCOUNTER — Telehealth: Payer: Self-pay | Admitting: Cardiology

## 2024-12-09 NOTE — Telephone Encounter (Signed)
 Pt is requesting a callback to r/s her nurse visit appt. Please advise

## 2024-12-10 ENCOUNTER — Ambulatory Visit

## 2024-12-12 ENCOUNTER — Other Ambulatory Visit: Payer: Self-pay | Admitting: Cardiology

## 2024-12-18 ENCOUNTER — Other Ambulatory Visit: Payer: Self-pay | Admitting: Cardiology

## 2024-12-18 ENCOUNTER — Ambulatory Visit

## 2024-12-25 ENCOUNTER — Ambulatory Visit
